# Patient Record
Sex: Female | Born: 1940 | Race: White | Hispanic: No | Marital: Married | Smoking: Never smoker
Health system: Southern US, Community
[De-identification: ages and names within clinical notes are randomized; demographics above are authoritative.]

## PROBLEM LIST (undated history)

## (undated) DIAGNOSIS — I359 Nonrheumatic aortic valve disorder, unspecified: Secondary | ICD-10-CM

## (undated) DIAGNOSIS — R609 Edema, unspecified: Secondary | ICD-10-CM

## (undated) DIAGNOSIS — I4891 Unspecified atrial fibrillation: Secondary | ICD-10-CM

## (undated) DIAGNOSIS — K219 Gastro-esophageal reflux disease without esophagitis: Secondary | ICD-10-CM

## (undated) DIAGNOSIS — M1990 Unspecified osteoarthritis, unspecified site: Secondary | ICD-10-CM

## (undated) DIAGNOSIS — I10 Essential (primary) hypertension: Secondary | ICD-10-CM

## (undated) HISTORY — DX: Gastro-esophageal reflux disease without esophagitis: K21.9

## (undated) HISTORY — DX: Essential (primary) hypertension: I10

## (undated) HISTORY — DX: Edema, unspecified: R60.9

## (undated) HISTORY — DX: Nonrheumatic aortic valve disorder, unspecified: I35.9

## (undated) HISTORY — DX: Unspecified osteoarthritis, unspecified site: M19.90

## (undated) HISTORY — DX: Unspecified atrial fibrillation: I48.91

## (undated) HISTORY — DX: Morbid (severe) obesity due to excess calories: E66.01

---

## 2004-04-12 ENCOUNTER — Other Ambulatory Visit: Payer: Self-pay

## 2005-04-07 ENCOUNTER — Ambulatory Visit: Payer: Self-pay | Admitting: Internal Medicine

## 2006-04-08 ENCOUNTER — Ambulatory Visit: Payer: Self-pay | Admitting: Unknown Physician Specialty

## 2006-04-14 ENCOUNTER — Ambulatory Visit: Payer: Self-pay | Admitting: Internal Medicine

## 2007-04-16 ENCOUNTER — Ambulatory Visit: Payer: Self-pay | Admitting: Internal Medicine

## 2007-08-05 ENCOUNTER — Ambulatory Visit: Payer: Self-pay | Admitting: Pain Medicine

## 2007-08-17 ENCOUNTER — Ambulatory Visit: Payer: Self-pay | Admitting: Pain Medicine

## 2007-09-08 ENCOUNTER — Ambulatory Visit: Payer: Self-pay | Admitting: Physician Assistant

## 2007-09-16 ENCOUNTER — Ambulatory Visit: Payer: Self-pay | Admitting: Pain Medicine

## 2007-09-30 ENCOUNTER — Ambulatory Visit: Payer: Self-pay | Admitting: Pain Medicine

## 2007-10-18 ENCOUNTER — Ambulatory Visit: Payer: Self-pay | Admitting: Physician Assistant

## 2008-04-18 ENCOUNTER — Ambulatory Visit: Payer: Self-pay | Admitting: Internal Medicine

## 2008-04-28 ENCOUNTER — Ambulatory Visit: Payer: Self-pay | Admitting: Cardiology

## 2008-04-28 LAB — CONVERTED CEMR LAB
AST: 20 units/L (ref 0–37)
Albumin: 4 g/dL (ref 3.5–5.2)
Alkaline Phosphatase: 94 units/L (ref 39–117)
BUN: 20 mg/dL (ref 6–23)
Basophils Absolute: 0 10*3/uL (ref 0.0–0.1)
Basophils Relative: 0 % (ref 0–1)
Creatinine, Ser: 0.72 mg/dL (ref 0.40–1.20)
Eosinophils Absolute: 0.2 10*3/uL (ref 0.0–0.7)
INR: 1 (ref 0.0–1.5)
MCHC: 30.8 g/dL (ref 30.0–36.0)
MCV: 88.3 fL (ref 78.0–100.0)
Neutro Abs: 6.7 10*3/uL (ref 1.7–7.7)
Neutrophils Relative %: 63 % (ref 43–77)
Platelets: 311 10*3/uL (ref 150–400)
Prothrombin Time: 13.6 s (ref 11.6–15.2)
Total Bilirubin: 0.6 mg/dL (ref 0.3–1.2)
Total Protein: 7.2 g/dL (ref 6.0–8.3)
aPTT: 34 s (ref 24–37)

## 2008-05-02 ENCOUNTER — Inpatient Hospital Stay (HOSPITAL_BASED_OUTPATIENT_CLINIC_OR_DEPARTMENT_OTHER): Admission: RE | Admit: 2008-05-02 | Discharge: 2008-05-02 | Payer: Self-pay | Admitting: Cardiology

## 2008-05-02 ENCOUNTER — Ambulatory Visit: Payer: Self-pay | Admitting: Cardiology

## 2008-05-10 ENCOUNTER — Ambulatory Visit: Payer: Self-pay | Admitting: Cardiothoracic Surgery

## 2008-05-12 ENCOUNTER — Encounter: Payer: Self-pay | Admitting: Cardiothoracic Surgery

## 2008-05-12 ENCOUNTER — Ambulatory Visit (HOSPITAL_COMMUNITY): Admission: RE | Admit: 2008-05-12 | Discharge: 2008-05-12 | Payer: Self-pay | Admitting: Cardiothoracic Surgery

## 2008-05-12 ENCOUNTER — Encounter: Admission: RE | Admit: 2008-05-12 | Discharge: 2008-05-12 | Payer: Self-pay | Admitting: Cardiothoracic Surgery

## 2008-05-19 ENCOUNTER — Ambulatory Visit: Payer: Self-pay | Admitting: Cardiothoracic Surgery

## 2008-05-25 ENCOUNTER — Ambulatory Visit: Payer: Self-pay | Admitting: Internal Medicine

## 2008-05-25 ENCOUNTER — Encounter: Payer: Self-pay | Admitting: Cardiothoracic Surgery

## 2008-05-25 ENCOUNTER — Inpatient Hospital Stay (HOSPITAL_COMMUNITY): Admission: RE | Admit: 2008-05-25 | Discharge: 2008-07-03 | Payer: Self-pay | Admitting: Cardiothoracic Surgery

## 2008-05-25 ENCOUNTER — Ambulatory Visit: Payer: Self-pay | Admitting: Cardiothoracic Surgery

## 2008-05-25 HISTORY — PX: AORTIC VALVE REPLACEMENT: SHX41

## 2008-05-25 HISTORY — PX: OTHER SURGICAL HISTORY: SHX169

## 2008-06-05 HISTORY — PX: OTHER SURGICAL HISTORY: SHX169

## 2008-06-16 ENCOUNTER — Ambulatory Visit: Payer: Self-pay | Admitting: Internal Medicine

## 2008-06-27 ENCOUNTER — Ambulatory Visit: Payer: Self-pay | Admitting: Physical Medicine & Rehabilitation

## 2008-07-21 ENCOUNTER — Ambulatory Visit: Payer: Self-pay | Admitting: Cardiothoracic Surgery

## 2008-07-21 ENCOUNTER — Encounter: Admission: RE | Admit: 2008-07-21 | Discharge: 2008-07-21 | Payer: Self-pay | Admitting: Cardiothoracic Surgery

## 2008-08-18 ENCOUNTER — Ambulatory Visit: Payer: Self-pay | Admitting: Cardiothoracic Surgery

## 2008-08-28 ENCOUNTER — Ambulatory Visit: Payer: Self-pay | Admitting: Cardiology

## 2008-09-15 ENCOUNTER — Ambulatory Visit: Payer: Self-pay | Admitting: Cardiothoracic Surgery

## 2008-09-15 ENCOUNTER — Encounter: Admission: RE | Admit: 2008-09-15 | Discharge: 2008-09-15 | Payer: Self-pay | Admitting: Cardiothoracic Surgery

## 2008-10-27 ENCOUNTER — Ambulatory Visit: Payer: Self-pay | Admitting: Cardiology

## 2009-01-05 ENCOUNTER — Ambulatory Visit: Payer: Self-pay | Admitting: Cardiology

## 2009-06-06 DIAGNOSIS — I359 Nonrheumatic aortic valve disorder, unspecified: Secondary | ICD-10-CM | POA: Insufficient documentation

## 2009-06-06 DIAGNOSIS — I10 Essential (primary) hypertension: Secondary | ICD-10-CM | POA: Insufficient documentation

## 2009-06-06 DIAGNOSIS — K219 Gastro-esophageal reflux disease without esophagitis: Secondary | ICD-10-CM | POA: Insufficient documentation

## 2009-06-06 DIAGNOSIS — R609 Edema, unspecified: Secondary | ICD-10-CM | POA: Insufficient documentation

## 2009-06-06 DIAGNOSIS — M1990 Unspecified osteoarthritis, unspecified site: Secondary | ICD-10-CM | POA: Insufficient documentation

## 2009-06-20 ENCOUNTER — Ambulatory Visit: Payer: Self-pay | Admitting: Internal Medicine

## 2009-06-29 ENCOUNTER — Telehealth: Payer: Self-pay | Admitting: Cardiology

## 2009-07-06 ENCOUNTER — Ambulatory Visit: Payer: Self-pay | Admitting: Cardiology

## 2009-07-13 ENCOUNTER — Encounter: Payer: Self-pay | Admitting: Cardiology

## 2009-07-20 ENCOUNTER — Encounter: Payer: Self-pay | Admitting: Cardiology

## 2009-07-23 ENCOUNTER — Encounter: Payer: Self-pay | Admitting: Cardiology

## 2009-07-23 DIAGNOSIS — R9389 Abnormal findings on diagnostic imaging of other specified body structures: Secondary | ICD-10-CM | POA: Insufficient documentation

## 2009-07-24 ENCOUNTER — Ambulatory Visit: Payer: Self-pay

## 2009-07-24 ENCOUNTER — Encounter: Payer: Self-pay | Admitting: Cardiology

## 2009-08-03 ENCOUNTER — Encounter: Payer: Self-pay | Admitting: Cardiology

## 2009-08-08 ENCOUNTER — Encounter: Payer: Self-pay | Admitting: Cardiology

## 2009-08-09 ENCOUNTER — Telehealth: Payer: Self-pay | Admitting: Cardiology

## 2009-08-15 ENCOUNTER — Encounter (INDEPENDENT_AMBULATORY_CARE_PROVIDER_SITE_OTHER): Payer: Self-pay | Admitting: Cardiology

## 2009-08-20 ENCOUNTER — Telehealth: Payer: Self-pay | Admitting: Cardiology

## 2009-08-22 ENCOUNTER — Encounter (INDEPENDENT_AMBULATORY_CARE_PROVIDER_SITE_OTHER): Payer: Self-pay | Admitting: *Deleted

## 2009-08-24 ENCOUNTER — Ambulatory Visit (HOSPITAL_COMMUNITY): Admission: RE | Admit: 2009-08-24 | Discharge: 2009-08-24 | Payer: Self-pay | Admitting: Internal Medicine

## 2009-08-24 ENCOUNTER — Ambulatory Visit: Payer: Self-pay | Admitting: Internal Medicine

## 2009-08-27 ENCOUNTER — Telehealth: Payer: Self-pay | Admitting: Cardiology

## 2009-08-29 ENCOUNTER — Encounter (INDEPENDENT_AMBULATORY_CARE_PROVIDER_SITE_OTHER): Payer: Self-pay | Admitting: Cardiology

## 2009-08-31 ENCOUNTER — Ambulatory Visit: Payer: Self-pay | Admitting: Cardiology

## 2009-08-31 ENCOUNTER — Ambulatory Visit: Payer: Self-pay | Admitting: Cardiovascular Disease

## 2009-09-12 ENCOUNTER — Encounter (INDEPENDENT_AMBULATORY_CARE_PROVIDER_SITE_OTHER): Payer: Self-pay | Admitting: Cardiology

## 2009-09-27 ENCOUNTER — Encounter (INDEPENDENT_AMBULATORY_CARE_PROVIDER_SITE_OTHER): Payer: Self-pay | Admitting: Cardiology

## 2009-10-11 ENCOUNTER — Encounter: Payer: Self-pay | Admitting: Cardiology

## 2009-10-15 IMAGING — CT CT ANGIO CHEST
4 of 6 series · 19 of 36 positions shown · IV contrast (agent unspecified)
Comparison: None

CLINICAL DATA: Thoracic aortic aneurysm

CT ANGIOGRAPHY CHEST
TECHNIQUE: Multidetector CT imaging of the chest using the
standard protocol during bolus administration of intravenous
contrast. Multiplanar reconstructed images obtained and reviewed to
evaluate the vascular anatomy.
Contrast: 100 ml [id number]

[Series 5: angio · axial · 0.79mm/px · z∈[-354,-14]mm · 11 of 164 slices shown]
[im 14/164  lung]
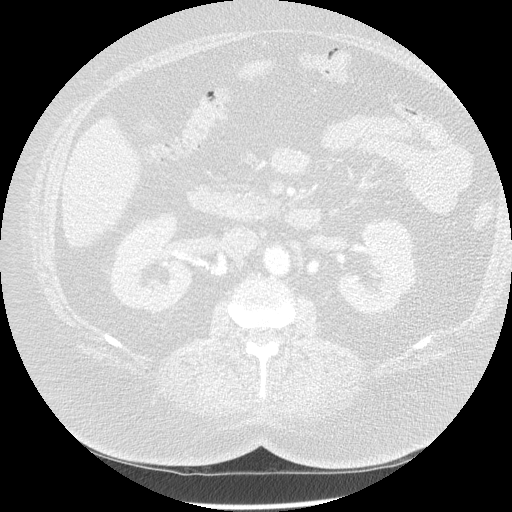
[im 28/164  mediastinal]
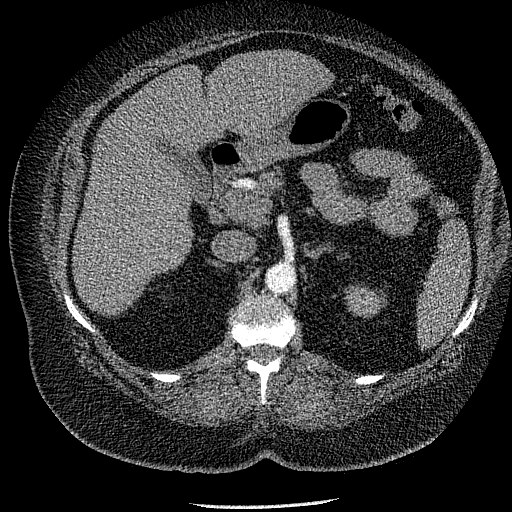
[im 41/164  lung]
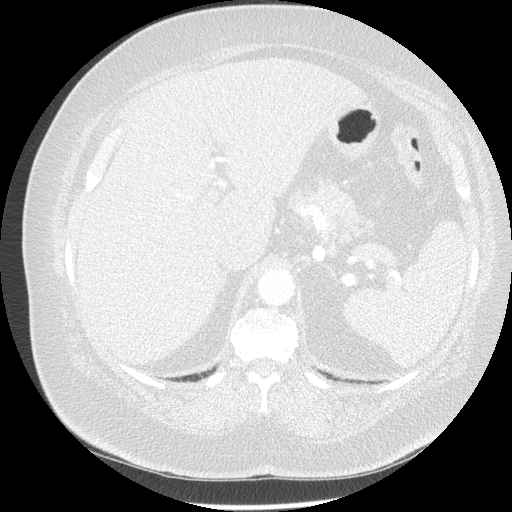
[im 55/164  mediastinal]
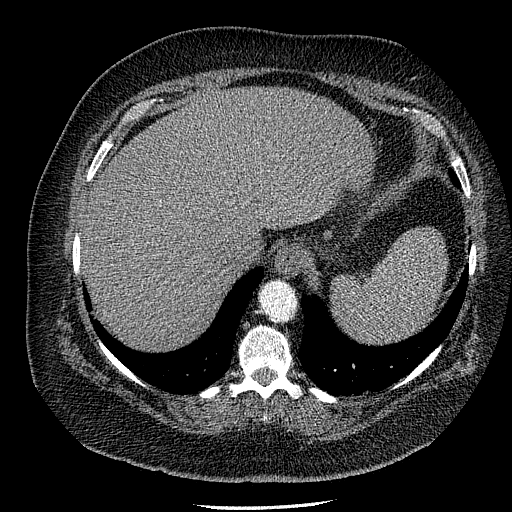
[im 68/164  lung]
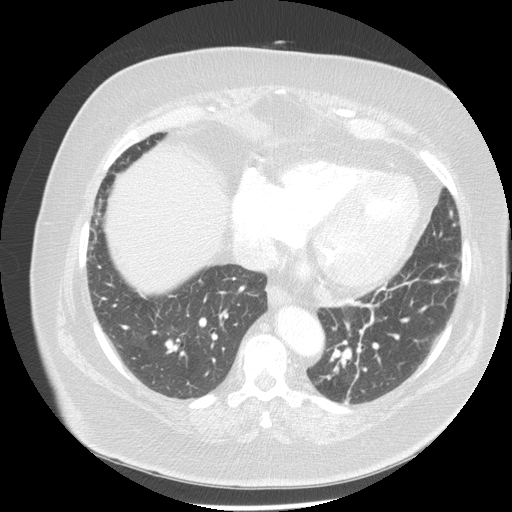
[im 82/164  mediastinal]
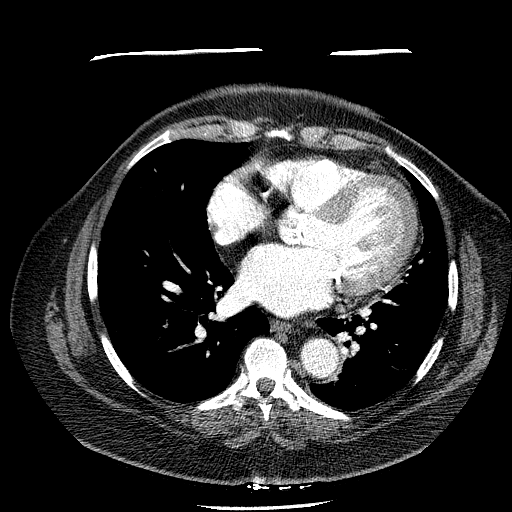
[im 96/164  lung]
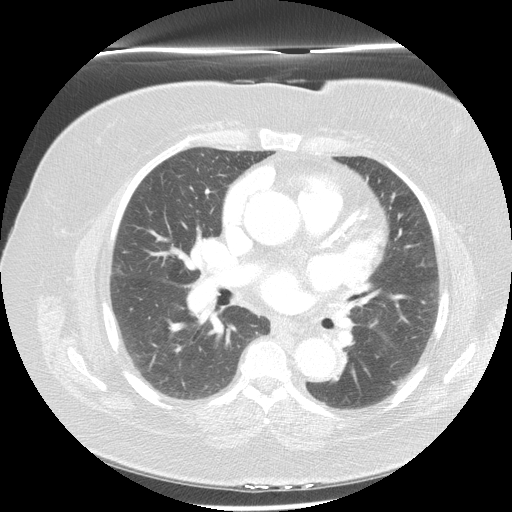
[im 109/164  mediastinal]
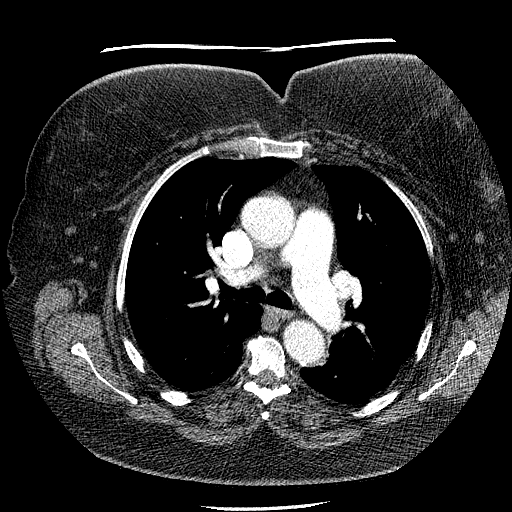
[im 123/164  lung]
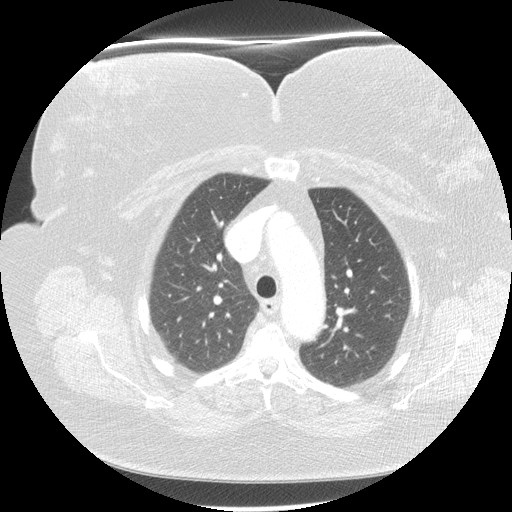
[im 136/164  mediastinal]
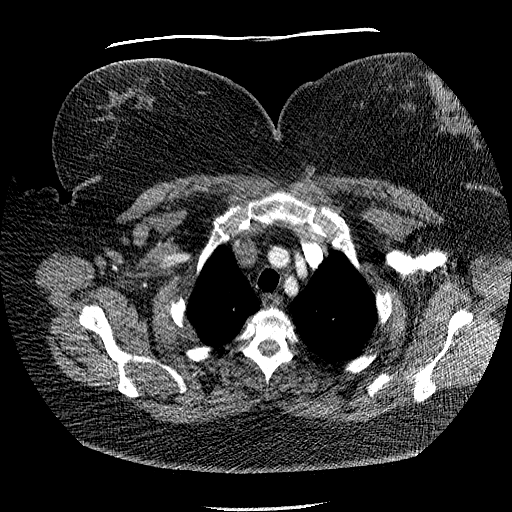
[im 150/164  lung]
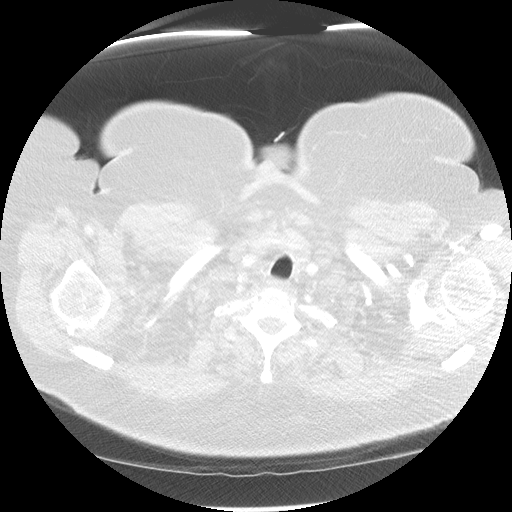

[Series 6: lung windows · axial · 0.79mm/px · z∈[-264,-194]mm · 2 of 72 slices shown]
[im 15/72  mediastinal]
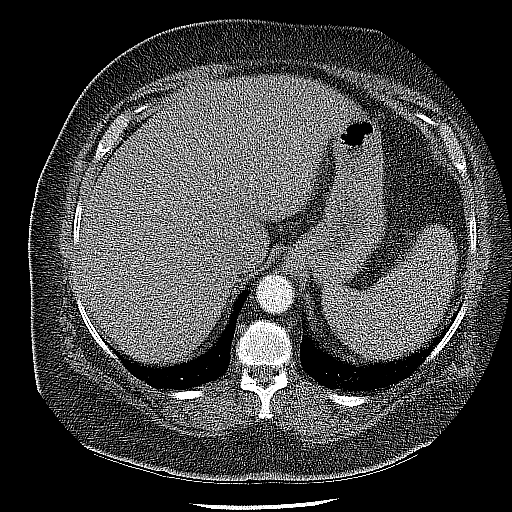
[im 29/72  mediastinal]
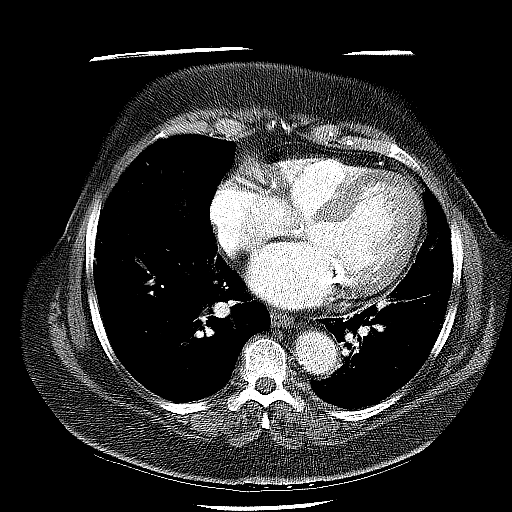

[Series 400: oblique · sagittal · 0.82mm/px · 3 of 66 slices shown]
[im 17/66  lung]
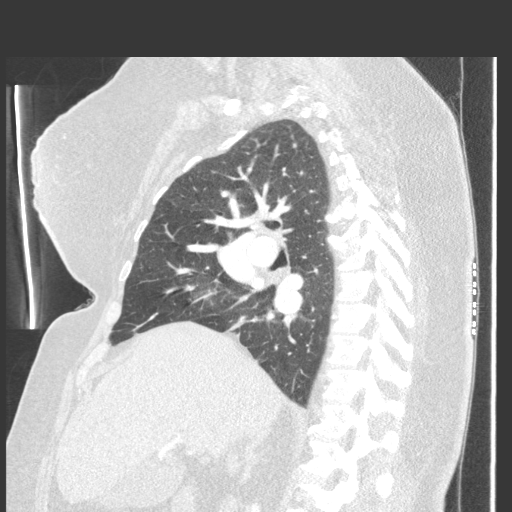
[im 33/66  lung]
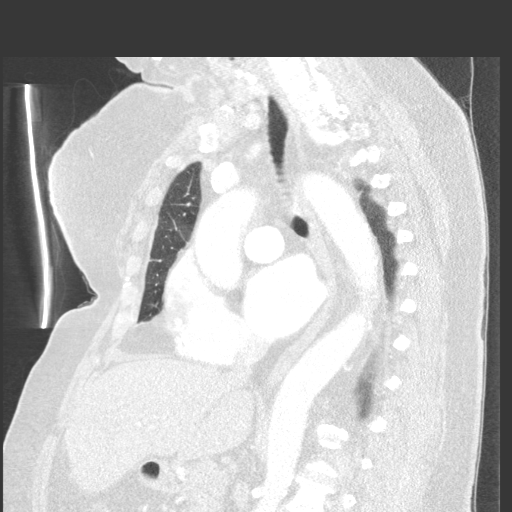
[im 49/66  lung]
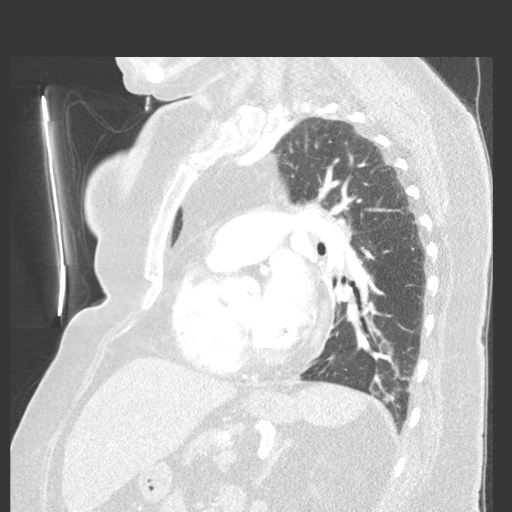

[Series 602: sagittal body · sagittal · 0.80mm/px · 3 of 164 slices shown]
[im 33/164  mediastinal]
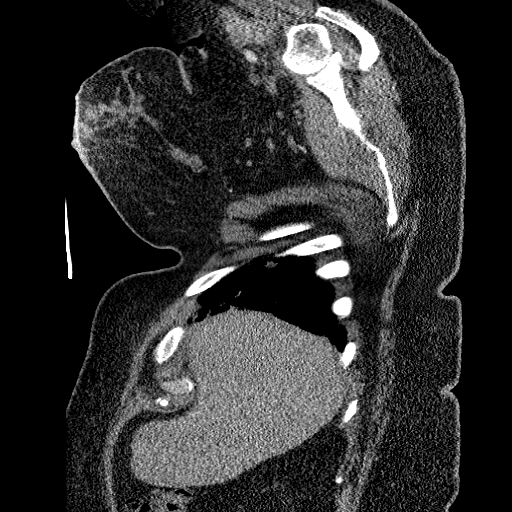
[im 66/164  mediastinal]
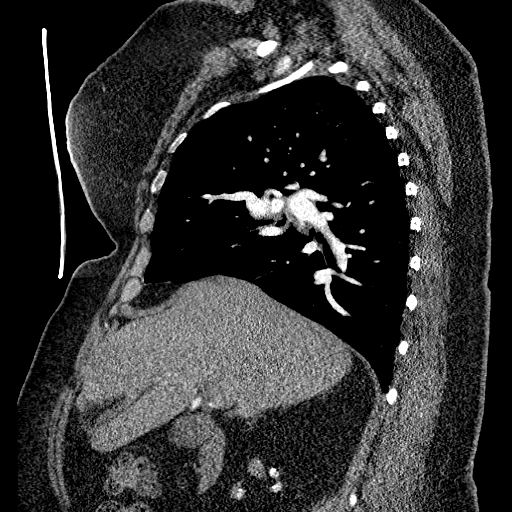
[im 98/164  mediastinal]
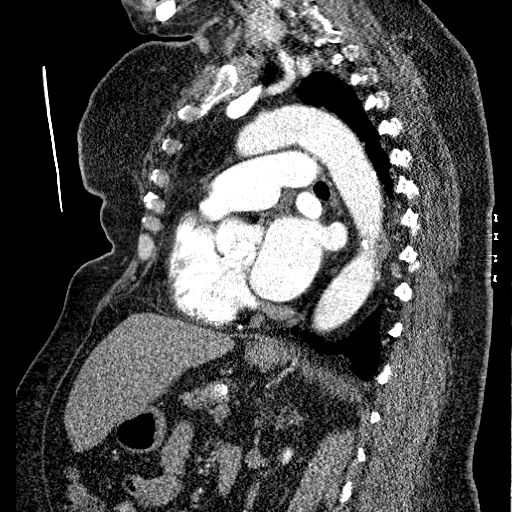

[19 of 36 positions shown; findings below may reference images not displayed]

FINDINGS: There is no hyperdense crescent in the wall of the
thoracic aorta to suggest the presence of intramural hematoma.
Imaging after IV contrast administration shows borderline thoracic
aortic aneurysm.  The ascending thoracic aorta measures 4.0 x
cm in maximum orthogonal diameters. Transverse thoracic aorta
measures 3 cm in maximum diameter.  The descending thoracic aorta
is tortuous measuring 3.1 cm in maximum diameter. Arch vessel
branching pattern is normal although the arch vessels follow a
tortuous course in the mediastinum.

There is no axillary, mediastinal, or hilar lymphadenopathy.  No
evidence for thyroid nodule.  The heart is enlarged.  There is no
pericardial effusion.  Coronary artery, mitral valve, and aortic
valve calcification is evident.  No evidence for pleural effusion.

Lung windows show a 6 mm right lower lobe pulmonary nodule on image
43.  Subsegmental elective cysts and / or linear scarring is seen
in both lower lobes..
IMPRESSION: Borderline aneurysm of the ascending thoracic aorta without
aneurysmal dilatation of the transverse or descending segments.

6 mm right lower lobe pulmonary nodule. In a patient with no
history of smoking or malignancy, followup CT chest without
contrast in 6-12 months can be used to assess stability.  In a high
risk patient followup imaging in 3 months is recommended.

## 2009-11-28 ENCOUNTER — Encounter: Payer: Self-pay | Admitting: Cardiology

## 2009-12-03 ENCOUNTER — Ambulatory Visit: Payer: Self-pay | Admitting: Cardiology

## 2009-12-03 DIAGNOSIS — Z954 Presence of other heart-valve replacement: Secondary | ICD-10-CM

## 2009-12-03 DIAGNOSIS — I2510 Atherosclerotic heart disease of native coronary artery without angina pectoris: Secondary | ICD-10-CM | POA: Insufficient documentation

## 2010-01-12 ENCOUNTER — Ambulatory Visit: Payer: Self-pay | Admitting: Cardiology

## 2010-01-12 ENCOUNTER — Inpatient Hospital Stay (HOSPITAL_COMMUNITY): Admission: EM | Admit: 2010-01-12 | Discharge: 2010-01-17 | Payer: Self-pay | Admitting: Emergency Medicine

## 2010-01-13 ENCOUNTER — Encounter: Payer: Self-pay | Admitting: Cardiology

## 2010-01-18 ENCOUNTER — Encounter: Payer: Self-pay | Admitting: Cardiology

## 2010-01-18 ENCOUNTER — Telehealth: Payer: Self-pay | Admitting: Cardiology

## 2010-01-30 ENCOUNTER — Ambulatory Visit: Payer: Self-pay | Admitting: Cardiology

## 2010-01-31 ENCOUNTER — Encounter: Payer: Self-pay | Admitting: Cardiology

## 2010-02-06 ENCOUNTER — Encounter: Payer: Self-pay | Admitting: Cardiology

## 2010-02-19 ENCOUNTER — Encounter: Payer: Self-pay | Admitting: Cardiology

## 2010-03-04 ENCOUNTER — Encounter: Payer: Self-pay | Admitting: Cardiology

## 2010-08-09 ENCOUNTER — Ambulatory Visit: Payer: Self-pay | Admitting: Internal Medicine

## 2010-09-13 ENCOUNTER — Ambulatory Visit: Payer: Self-pay | Admitting: Cardiology

## 2011-01-28 NOTE — Assessment & Plan Note (Signed)
Summary: eph/  gd   Visit Type:  Follow-up Primary Provider:  Bethann Punches, MD  CC:  AVR.  History of Present Illness: The patient presents for followup after hospitalization for management of dyspnea with acute on chronic next systolic and diastolic heart failure. She ruled out for myocardial infarction and did have a stress perfusion study in hospital demonstrating an EF of 50% with no evidence of ischemia or infarct. She was diureses. She was made to keep her legs elevated which greatly helped reduce her lower extremity edema.  Since going home she has been trying to keep her feet elevated and she says her weight has been down. Her legs have been less swollen. Physical therapy and a nurse her coming to her house. She is ambulating. She is not having any falls and none of the difficulty with balance or gait but she was having previously. The cough that she was experiencing in the hospital seems to be improved. She's had no fevers or chills. She is not describing PND or orthopnea. She's had no palpitations, presyncope or syncope. She denies any chest pain.  Current Medications (verified): 1)  Potassium Chloride Cr 10 Meq Cr-Tabs (Potassium Chloride) .... 2 By Mouth Daily 2)  Vitamin B-1 .... Daily 3)  Aspirin 81 Mg  Tabs (Aspirin) .Marland Kitchen.. 1 By Mouth Daily 4)  Evening Primrose Oil 500 Mg Caps (Evening Primrose Oil) .... Daily 5)  Diltiazem Hcl Er Beads 180 Mg Xr24h-Cap (Diltiazem Hcl Er Beads) .... Daily 6)  Metoprolol Tartrate 50 Mg Tabs (Metoprolol Tartrate) .... Two Times A Day 7)  Torsemide 20 Mg Tabs (Torsemide) .Marland Kitchen.. 1 By Mouth Two Times A Day 8)  Ranitidine Hcl 150 Mg Caps (Ranitidine Hcl) .... Two Times A Day 9)  Simvastatin 40 Mg Tabs (Simvastatin) .... Daily 10)  Gabapentin 300 Mg Caps (Gabapentin) .... Two Times A Day As Needed 11)  Warfarin Sodium 5 Mg Tabs (Warfarin Sodium) .... One By Mouth Daily or As Directed 12)  Acetaminophen 325 Mg  Tabs (Acetaminophen) .... As Needed 13)   Vitron-C 125-200 Mg Tabs (Ferrous Fumarate-Vitamin C) .Marland Kitchen.. 1 By Mouth Daily 14)  Losartan Potassium 100 Mg Tabs (Losartan Potassium) .Marland Kitchen.. 1 By Mouth Daily 15)  Crestor 20 Mg Tabs (Rosuvastatin Calcium) .... 1/2  By Mouth Daily 16)  Metolazone 2.5 Mg Tabs (Metolazone) .... One By Mouth Every Monday Am  Allergies (verified): 1)  ! Sulfa  Past History:  Past Medical History: Reviewed history from 07/06/2009 and no changes required. AORTIC STENOSIS, SEVERE (ICD-424.1) FIBRILLATION, ATRIAL (ICD-427.31) HYPERTENSION, UNSPECIFIED (ICD-401.9) EDEMA LEG (ICD-782.3) OBESITY, MORBID (ICD-278.01) GERD (ICD-530.81) OSTEOARTHRITIS (ICD-715.90)    Past Surgical History: Reviewed history from 07/06/2009 and no changes required.  1. 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   2. Coronary bypass grafting x2 (left internal mammary artery LAD, ..05/25/2008       saphenous vein graft to distal RCA).   3. Endoscopic vein harvest of the right leg greater saphenous vein. ..05/25/2008   4. Placement of tracheostomy (#8 Shiley). .. 06/05/2008  5. Placement of left pleural chest tube for drainage of pleural       effusion. ... 06/05/2008  6. Flexible bronchoscopy for airway secretions. .. 06/05/2008   SURGEON:  Kerin Perna, MD.   Review of Systems       As stated in the HPI and negative for all other systems.   Vital  Signs:  Patient profile:   70 year old female Height:      65 inches Weight:      280 pounds BMI:     46.76 Pulse rate:   97 / minute Resp:     18 per minute BP sitting:   144 / 82  (right arm)  Vitals Entered By: Marrion Coy, CNA (January 30, 2010 3:17 PM)  Physical Exam  General:  Well developed, well nourished, in no acute distress. Head:  normocephalic and atraumatic Eyes:  PERRLA/EOM intact; conjunctiva and lids normal. Mouth:  Edentulous, gums and palate normal. Oral  mucosa normal. Neck:  Neck supple, no JVD. No masses, thyromegaly or abnormal cervical nodes. Chest Wall:  Well healed surgical scar Lungs:  Clear bilaterally to auscultation and percussion. Abdomen:  Bowel sounds positive; abdomen soft and non-tender without masses, organomegaly, or hernias noted. No hepatosplenomegaly, obese Msk:  Back normal, normal gait. Muscle strength and tone normal. Extremities:  mild bilateral lower extremity edema Neurologic:  Alert and oriented x 3. Skin:  Intact without lesions or rashes. Psych:  Normal affect.   Detailed Cardiovascular Exam  Neck    Carotids: Carotids full and equal bilaterally without bruits.      Neck Veins: Normal, no JVD.    Heart    Inspection: no deformities or lifts noted.      Palpation: normal PMI with no thrills palpable.      Auscultation: irregular rate and rhythm, S1, S2 without rubs, gallops, or clicks, 2/6 systolic murmur radiating slightly up the aortic outflow tract   Vascular    Abdominal Aorta: no palpable masses, pulsations, or audible bruits.      Femoral Pulses: normal femoral pulses bilaterally.      Pedal Pulses: diminished right dorsalis pedis pulse and diminished right posterior tibial pulse.      Radial Pulses: normal radial pulses bilaterally.      Peripheral Circulation: no clubbing, cyanosis, or edema noted with normal capillary refill.     EKG  Procedure date:  01/30/2010  Findings:      atrial fibrillation, rate 97, right bundle branch block, premature ventricular contraction  Impression & Recommendations:  Problem # 1:  CHRONIC COMB SYSTOLIC&DIASTOLIC HEART FAILURE (ICD-428.42) The patient seems to be relatively euvolemic and compliant with medications. I have given her written instructions to get a basic metabolic profile when she sees Dr. Hyacinth Meeker. He apparently checks her blood work frequently.  Otherwise I will make no change to her medical regimen.  Problem # 2:  FIBRILLATION, ATRIAL.Marland Kitchen  POSTOPERATIVE (ICD-427.31) Her ventricular rhythm has been controlled. She is not falling and I am not concerned about this as a risk for Coumadin. She certainly needs Coumadin for stroke prophylaxis. This is followed closely by Dr. Hyacinth Meeker. Orders: EKG w/ Interpretation (93000)  Problem # 3:  OBESITY, MORBID (ICD-278.01) She understands the need to lose weight with diet and exercise.  Problem # 4:  CAD (ICD-414.00) Her stress perfusion study was negative in the hospital. No further testing is suggested. Orders: EKG w/ Interpretation (93000)  Patient Instructions: 1)  Your physician recommends that you schedule a follow-up appointment in: 6 months with Dr Antoine Poche 2)  Your physician recommends that you continue on your current medications as directed. Please refer to the Current Medication list given to you today.

## 2011-01-28 NOTE — Letter (Signed)
Summary: Home Health Cert and Plan of Care  Home Health Cert and Plan of Care   Imported By: Marylou Mccoy 02/14/2010 13:28:16  _____________________________________________________________________  External Attachment:    Type:   Image     Comment:   External Document

## 2011-01-28 NOTE — Miscellaneous (Signed)
Summary: [facility] Orders  [facility] Orders   Imported By: Roderic Ovens 03/21/2010 14:31:16  _____________________________________________________________________  External Attachment:    Type:   Image     Comment:   External Document

## 2011-01-28 NOTE — Assessment & Plan Note (Signed)
Summary: 6 month rov  428.0 pfh,rn   Visit Type:  Follow-up Primary Provider:  Bethann Punches, MD  CC:  AVR.  History of Present Illness: The patient presents for followup of aortic valve replacement. Since I last saw her she has had no acute problems. She denies any new shortness of breath, PND or orthopnea. She is predominantly limited by joint discomfort I suspect in part related to her morbid obesity. She has had none of the volume issues that she had earlier this year. She occasionally will feel her heart fluttering but this is not particularly problematic. She is not describing any PND or orthopnea. Has had no presyncope or syncope. She's had no chest pressure, neck or arm discomfort.  Current Medications (verified): 1)  Potassium Chloride Cr 10 Meq Cr-Tabs (Potassium Chloride) .... 2 By Mouth Daily 2)  Aspirin 81 Mg  Tabs (Aspirin) .Marland Kitchen.. 1 By Mouth Daily 3)  Evening Primrose Oil 500 Mg Caps (Evening Primrose Oil) .... Daily 4)  Diltiazem Hcl Er Beads 180 Mg Xr24h-Cap (Diltiazem Hcl Er Beads) .... Daily 5)  Metoprolol Tartrate 50 Mg Tabs (Metoprolol Tartrate) .... Two Times A Day 6)  Torsemide 20 Mg Tabs (Torsemide) .Marland Kitchen.. 1 By Mouth Two Times A Day 7)  Ranitidine Hcl 150 Mg Caps (Ranitidine Hcl) .... Two Times A Day 8)  Simvastatin 40 Mg Tabs (Simvastatin) .... Daily 9)  Gabapentin 300 Mg Caps (Gabapentin) .... Two Times A Day As Needed 10)  Warfarin Sodium 5 Mg Tabs (Warfarin Sodium) .... One By Mouth Daily or As Directed 11)  Acetaminophen 325 Mg  Tabs (Acetaminophen) .... As Needed 12)  Vitron-C 125-200 Mg Tabs (Ferrous Fumarate-Vitamin C) .Marland Kitchen.. 1 By Mouth Daily 13)  Losartan Potassium 100 Mg Tabs (Losartan Potassium) .Marland Kitchen.. 1 By Mouth Daily 14)  Metolazone 2.5 Mg Tabs (Metolazone) .... One By Mouth Every Monday Am 15)  Sertraline Hcl 50 Mg Tabs (Sertraline Hcl) .Marland Kitchen.. 1 Podaily 16)  Meloxicam 7.5 Mg Tabs (Meloxicam) .... As Needed 17)  Magnesium 500 Mg Tabs (Magnesium) .Marland Kitchen.. 1 Pod  Aily 18)  Vitamin C 500 Mg Tabs (Ascorbic Acid) .Marland Kitchen.. 1 By Mouth Daily  Allergies (verified): 1)  ! Sulfa  Past History:  Past Medical History: Reviewed history from 07/06/2009 and no changes required. AORTIC STENOSIS, SEVERE (ICD-424.1) FIBRILLATION, ATRIAL (ICD-427.31) HYPERTENSION, UNSPECIFIED (ICD-401.9) EDEMA LEG (ICD-782.3) OBESITY, MORBID (ICD-278.01) GERD (ICD-530.81) OSTEOARTHRITIS (ICD-715.90)    Past Surgical History: Reviewed history from 07/06/2009 and no changes required.  1. 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   2. Coronary bypass grafting x2 (left internal mammary artery LAD, ..05/25/2008       saphenous vein graft to distal RCA).   3. Endoscopic vein harvest of the right leg greater saphenous vein. ..05/25/2008   4. Placement of tracheostomy (#8 Shiley). .. 06/05/2008  5. Placement of left pleural chest tube for drainage of pleural       effusion. ... 06/05/2008  6. Flexible bronchoscopy for airway secretions. .. 06/05/2008   SURGEON:  Kerin Perna, MD.   Review of Systems       As stated in the HPI and negative for all other systems.   Vital Signs:  Patient profile:   70 year old female Height:      65 inches Weight:      305 pounds BMI:     50.94 Pulse rate:   86 / minute  Resp:     18 per minute BP sitting:   141 / 96  (right arm)  Vitals Entered By: Marrion Coy, CNA (September 13, 2010 3:42 PM)  Physical Exam  General:  Well developed, well nourished, in no acute distress. Head:  normocephalic and atraumatic Mouth:  Edentulous, gums and palate normal. Oral mucosa normal. Neck:  Neck supple, no JVD. No masses, thyromegaly or abnormal cervical nodes. Chest Wall:  Well healed surgical scar Lungs:  Clear bilaterally to auscultation and percussion. Abdomen:  Bowel sounds positive; abdomen soft and non-tender without masses, organomegaly, or  hernias noted. No hepatosplenomegaly, obese Msk:  Back normal, normal gait. Muscle strength and tone normal. Extremities:  mild bilateral lower extremity edema Neurologic:  Alert and oriented x 3. Skin:  Intact without lesions or rashes. Cervical Nodes:  no significant adenopathy Inguinal Nodes:  no significant adenopathy Psych:  Normal affect.   Detailed Cardiovascular Exam  Neck    Carotids: Carotids full and equal bilaterally without bruits.      Neck Veins: Normal, no JVD.    Heart    Inspection: no deformities or lifts noted.      Palpation: normal PMI with no thrills palpable.      Auscultation: irregular rate and rhythm, S1, S2 without rubs, gallops, or clicks, 2/6 systolic murmur radiating slightly up the aortic outflow tract   Vascular    Abdominal Aorta: no palpable masses, pulsations, or audible bruits.      Femoral Pulses: normal femoral pulses bilaterally.      Pedal Pulses: diminished right dorsalis pedis pulse and diminished right posterior tibial pulse.      Radial Pulses: normal radial pulses bilaterally.      Peripheral Circulation: no clubbing, cyanosis, or edema noted with normal capillary refill.     EKG  Procedure date:  09/13/2010  Findings:      atrial fibrillation, low voltage, rate 86, right bundle branch block, no acute ST-T wave changes  Impression & Recommendations:  Problem # 1:  AORTIC VALVE REPLACEMENT, HX OF (ICD-V43.3) At this point no further cardiovascular testing is suggested. She will continue with the medications as listed. Orders: EKG w/ Interpretation (93000)  Problem # 2:  FIBRILLATION, ATRIAL.Marland Kitchen POSTOPERATIVE (ICD-427.31) The patient does not have particularly symptomatic tachypalpitations. She tolerates Coumadin. She has had reasonable rate control. No change in therapy is indicated. Orders: EKG w/ Interpretation (93000)  Problem # 3:  CAD (ICD-414.00) The patient is having no ongoing angina. She should continue with risk  reduction. I will defer followup of her lipid status to her primary physician.  Problem # 4:  HYPERTENSION, UNSPECIFIED (ICD-401.9) Though her blood pressure is slightly elevated today she reports is in the 120s over 80s routinely. Therefore, I will make no change to her regimen.  Patient Instructions: 1)  Your physician recommends that you schedule a follow-up appointment in: 12 months with Dr Antoine Poche 2)  Your physician recommends that you continue on your current medications as directed. Please refer to the Current Medication list given to you today.

## 2011-01-28 NOTE — Letter (Signed)
Summary: [facility] Patient Care Update  [facility] Patient Care Update   Imported By: Roderic Ovens 03/11/2010 14:31:58  _____________________________________________________________________  External Attachment:    Type:   Image     Comment:   External Document

## 2011-01-28 NOTE — Progress Notes (Signed)
Summary: QUESTION ABOUT PT MEDICATIONS  Phone Note Other Incoming   Caller: [facility] /[phone] [facility] Summary of Call: Have question about the pt medication Initial call taken by: Judie Grieve,  January 18, 2010 11:05 AM     Appended Document: QUESTION ABOUT PT MEDICATIONS Spoke [facility] Nurse Bonita Quin, pt needs rx for metolazone 2.5 mg to be taken every Monday am, will send to [facility].  Pt to be on simvastatin per d/c summary.  Pt is waiting on mail order meds to come in the [facility] will f/u to make sure she recieves it.

## 2011-01-28 NOTE — Letter (Signed)
Summary: [facility]  [facility]   Imported By: Marylou Mccoy 07/16/2010 14:04:33  _____________________________________________________________________  External Attachment:    Type:   Image     Comment:   External Document

## 2011-01-28 NOTE — Miscellaneous (Signed)
Summary: Orders Update  Clinical Lists Changes  Medications: Added new medication of METOLAZONE 2.5 MG TABS (METOLAZONE) one by mouth every Monday am - Signed Rx of METOLAZONE 2.5 MG TABS (METOLAZONE) one by mouth every Monday am;  #30 x 1;  Signed;  Entered by: Charolotte Capuchin, RN;  Authorized by: Rollene Rotunda, MD, [facility];  Method used: [facility] to [id number] [address] [place], [place]  [address], Ph: [phone] or [phone], Fax: [phone]    Prescriptions: METOLAZONE 2.5 MG TABS (METOLAZONE) one by mouth every Monday am  #30 x 1   Entered by:   Charolotte Capuchin, RN   Authorized by:   Rollene Rotunda, MD, [facility]   Signed by:   Charolotte Capuchin, RN on 01/18/2010   Method used:   [facility]ically to        [id number] (retail)       [address]       [place], [place]  [address]       Ph: [phone] or [phone]       Fax: [phone]   RxID:   [id number]

## 2011-03-16 LAB — BASIC METABOLIC PANEL
BUN: 15 mg/dL (ref 6–23)
BUN: 14 mg/dL (ref 6–23)
BUN: 19 mg/dL (ref 6–23)
CO2: 31 mEq/L (ref 19–32)
Calcium: 9 mg/dL (ref 8.4–10.5)
Calcium: 9 mg/dL (ref 8.4–10.5)
Calcium: 9.3 mg/dL (ref 8.4–10.5)
Chloride: 97 mEq/L (ref 96–112)
Chloride: 100 mEq/L (ref 96–112)
Chloride: 99 mEq/L (ref 96–112)
Creatinine, Ser: 0.61 mg/dL (ref 0.4–1.2)
Creatinine, Ser: 0.65 mg/dL (ref 0.4–1.2)
Creatinine, Ser: 0.74 mg/dL (ref 0.4–1.2)
GFR calc Af Amer: >60 mL/min (ref >60)
GFR calc non Af Amer: >60 mL/min (ref >60)
GFR calc non Af Amer: >60 mL/min (ref >60)
GFR calc non Af Amer: >60 mL/min (ref >60)
GFR calc non Af Amer: >60 mL/min (ref >60)
Glucose, Bld: 102 mg/dL — ABNORMAL HIGH (ref 70–99)
Glucose, Bld: 104 mg/dL — ABNORMAL HIGH (ref 70–99)
Glucose, Bld: 107 mg/dL — ABNORMAL HIGH (ref 70–99)
Potassium: 3.4 mEq/L — ABNORMAL LOW (ref 3.5–5.1)
Potassium: 3.6 mEq/L (ref 3.5–5.1)
Sodium: 138 mEq/L (ref 135–145)

## 2011-03-16 LAB — PROTIME-INR
INR: 1.39 (ref 0.00–1.49)
INR: 1.4 (ref 0.00–1.49)
INR: 1.41 (ref 0.00–1.49)
INR: 1.51 — ABNORMAL HIGH (ref 0.00–1.49)
INR: 1.76 — ABNORMAL HIGH (ref 0.00–1.49)
Prothrombin Time: 20.7 seconds — ABNORMAL HIGH (ref 11.6–15.2)
Prothrombin Time: 16.9 seconds — ABNORMAL HIGH (ref 11.6–15.2)
Prothrombin Time: 17 seconds — ABNORMAL HIGH (ref 11.6–15.2)
Prothrombin Time: 17.1 seconds — ABNORMAL HIGH (ref 11.6–15.2)
Prothrombin Time: 18.1 seconds — ABNORMAL HIGH (ref 11.6–15.2)
Prothrombin Time: 20.4 seconds — ABNORMAL HIGH (ref 11.6–15.2)

## 2011-03-16 LAB — CK TOTAL AND CKMB (NOT AT ARMC)
CK, MB: 2.5 ng/mL (ref 0.3–4.0)
Relative Index: INVALID (ref 0.0–2.5)
Relative Index: INVALID (ref 0.0–2.5)
Relative Index: INVALID (ref 0.0–2.5)
Total CK: 40 U/L (ref 7–177)

## 2011-03-16 LAB — CBC
HCT: 42.9 % (ref 36.0–46.0)
Hemoglobin: 14.1 g/dL (ref 12.0–15.0)
Hemoglobin: 14.6 g/dL (ref 12.0–15.0)
MCV: 79.7 fL (ref 78.0–100.0)
Platelets: 150 10*3/uL (ref 150–400)
Platelets: 214 10*3/uL (ref 150–400)
RBC: 5.33 MIL/uL — ABNORMAL HIGH (ref 3.87–5.11)
RDW: 22.6 % — ABNORMAL HIGH (ref 11.5–15.5)
RDW: 22.1 % — ABNORMAL HIGH (ref 11.5–15.5)
RDW: 22.2 % — ABNORMAL HIGH (ref 11.5–15.5)
WBC: 8.5 10*3/uL (ref 4.0–10.5)
WBC: 7.9 10*3/uL (ref 4.0–10.5)
WBC: 8.6 10*3/uL (ref 4.0–10.5)

## 2011-03-16 LAB — HEPARIN LEVEL (UNFRACTIONATED): Heparin Unfractionated: 0.55 IU/mL (ref 0.30–0.70)

## 2011-03-16 LAB — DIFFERENTIAL
Basophils Absolute: 0 10*3/uL (ref 0.0–0.1)
Eosinophils Relative: 0 % (ref 0–5)
Monocytes Absolute: 0.8 10*3/uL (ref 0.1–1.0)
Neutrophils Relative %: 75 % (ref 43–77)

## 2011-03-16 LAB — BRAIN NATRIURETIC PEPTIDE
Pro B Natriuretic peptide (BNP): 440 pg/mL — ABNORMAL HIGH (ref 0.0–100.0)
Pro B Natriuretic peptide (BNP): 221 pg/mL — ABNORMAL HIGH (ref 0.0–100.0)

## 2011-03-16 LAB — HEMOCCULT GUIAC POC 1CARD (OFFICE): Fecal Occult Bld: NEGATIVE

## 2011-03-16 LAB — TROPONIN I
Troponin I: 0.04 ng/mL (ref 0.00–0.06)
Troponin I: 0.05 ng/mL (ref 0.00–0.06)
Troponin I: 0.03 ng/mL (ref 0.00–0.06)

## 2011-03-16 LAB — POCT CARDIAC MARKERS: Myoglobin, poc: 108 ng/mL (ref 12–200)

## 2011-04-05 LAB — PROTIME-INR
INR: 3.2 — ABNORMAL HIGH (ref 0.00–1.49)
Prothrombin Time: 32.4 seconds — ABNORMAL HIGH (ref 11.6–15.2)

## 2011-04-05 LAB — BASIC METABOLIC PANEL
Calcium: 9 mg/dL (ref 8.4–10.5)
Creatinine, Ser: 0.67 mg/dL (ref 0.4–1.2)
GFR calc Af Amer: >60 mL/min (ref >60)
GFR calc non Af Amer: >60 mL/min (ref >60)

## 2011-04-05 LAB — CBC
Platelets: 191 10*3/uL (ref 150–400)
RBC: 5.26 MIL/uL — ABNORMAL HIGH (ref 3.87–5.11)
WBC: 7.5 10*3/uL (ref 4.0–10.5)

## 2011-05-13 NOTE — Discharge Summary (Signed)
NAME:  [name] [name], [name] [name] NO.:  [id number]   MEDICAL RECORD NO.:  [id number]          PATIENT TYPE:  INP   LOCATION:  3310                         FACILITY:  [facility]   PHYSICIAN:  Kerin Perna, M.D.  DATE OF BIRTH:  [birth year]   DATE OF ADMISSION:  05/25/2008  DATE OF DISCHARGE:                               DISCHARGE SUMMARY   PRIMARY ADMITTING DIAGNOSES:  1. Severe aortic stenosis.  2. Severe 2-vessel coronary artery disease with class III congestive      heart failure.   ADDITIONAL DISCHARGE DIAGNOSES:  1. Severe aortic stenosis.  2. Severe 2-vessel coronary artery disease with class III congestive      heart failure.  3. Postoperative atrial fibrillation.  4. Ventilator dependent respiratory failure.  5. Ventilator acquired pneumonia (Enterobacter cloacae).  6. Hypertension.  7. Osteoarthritis.  8. Gastroesophageal reflux disease.  9. History of benign breast mass status post biopsy.  10.History of right lower extremity phlebitis.  11.Postoperative anemia.  12.Postoperative deconditioning.  13.Postoperative dysphagia.   PROCEDURES PERFORMED:  1. Aortic valve replacement with Edwards Magna 23 mm pericardial      valve.  2. Coronary artery bypass grafting x2 (left internal mammary artery to      the ALD, saphenous vein graft to the distal right coronary artery).  3. Endoscopic vein harvest right leg.  4. Placement of #8 Shiley tracheostomy tube.  5. Placement of left pleural chest tube.  6. Flexible bronchoscopy.   HISTORY:  The patient is a 70 year old female who was seen in outpatient  consultation by Dr. Kathlee Nations Tright for severe aortic stenosis.  She  initially presented with progressive dyspnea on exertion and  significantly decreased exercise tolerance.  She has a known cardiac  murmur, which has been followed by Dr. Hyacinth Meeker for the past 4 to 5 years  with serial 2D echoes.  Most recently she was found to have an increase  in the  transvalvular gradient with a decreased aortic valve area to 0.8  with peak gradient of 87 mmHg.  She was subsequently referred to Dr.  Rollene Rotunda for left and right heart catheterization, which was  performed on May 02, 2008.  This showed severe aortic stenosis with 2-  vessel coronary artery disease, which involved the LAD and the right  coronary arteries.  Left ventricular ejection fraction was approximately  45%.  Dr. Morton Peters saw the patient on two occasions in the office and  reviewed the results of her cardiac catheterization.  He felt that her  best course of action would be to proceed with aortic valve replacement  and two bypasses.  She had a preoperative CT angiogram to assess her  ascending aorta, and this showed a 4 to 4.5 cm ascending aorta with no  true fusiform aneurysm.  She also had vein mapping performed due to a  history of DVT and vein mapping showed adequate conduit.  Pre-CABG  Dopplers showed no significant coronary artery disease and ABIs were 0.6  on the right and 0.8 on the left.  All risks, benefits and alternatives  to surgery were explained  to the patient and consent was obtained.   HOSPITAL COURSE:  [name] [name] was admitted to [facility] on May 25, 2008 and underwent CABG x2 as well as AVR as described in detail and  performed by Dr. Morton Peters.  She tolerated the procedure well and was  transferred to the SICU.  Her postoperative course was complicated by  hypoxia, which led to ventilator dependent respiratory failure.  She was  initially treated with aggressive diuresis and pulmonary toilet measures  without significant improvement in her pulmonary status.  She continued  to remain hypoxic and 10 days postoperatively after showing no  significant improvement in her weaning parameters, it was felt that she  should undergo a tracheostomy.  She was returned to the operating room  on June 05, 2008, and underwent placement of a #8 Shiley  tracheostomy  tube.  Also at the time, a left chest tube was placed for a persistent  left pleural effusion and bronchoscopy was performed for airway  secretions.  She developed a leucocytosis with purulent production, and  a sputum culture was positive for Enterobacter.  She was stated on  empiric antibiotic coverage with [place].  She completed the 10-day  course of Fortaz.  However, upon reculture she was found to have  persistent Enterobacter, which was resistant to [employer].  She was  therefore started on Primaxin and subsequently has completed a 10-day  course.  Her followup cultures have been negative and her sputum has  resolved.   During her ICU stay she also developed atrial fibrillation and was  started on IV amiodarone.  This was discontinued in order to prevent  further pulmonary issues.  She was subsequently switched to digoxin and  Cardizem.  She has been maintaining sinus rhythm since her initial  conversion.  She has been volume overloaded throughout her admission and  has been diuresed.  She was started on tube feeds for nutritional  support during her tenure on the ventilator.  She was also transfused  with packed red blood cells for acute blood loss anemia.  After her  tracheostomy, she slowly began to improve from a pulmonary standpoint.  She was continued on aggressive pulmonary toilet measures and was slowly  weaned from the ventilator per the CCM Protocol.  She was weaned down to  trach collar, then a Passy-Muir valve was placed.  She has tolerated  this well, and over the course of the past week, her trach has been  slowly downsized.  She was ultimately decannulated completely on June 27, 2008.  Thus far, she has tolerated decannulation well.  She is  maintaining stable O2 saturations.  She is running greater than 90% on  room air.  Site around her tach stoma is clean, dry and healing well.  She was initially considered for PEG placement while still ventilator   dependent.  However, once she was able to be weaned, she had a repeat  speech pathology evaluation and was slowly started on an oral diet.  She  has continued with aspiration precautions and presently has been  upgraded to a dysphagia 3 diet with thin liquids.  She appears to be  tolerating this well without evidence of dysphagia or aspiration.  As  mentioned above, she has completed a 10-day course of Primaxin for  recurrent Enterobacter.  She has been working with PT, OT and cardiac  rehabilitation phase 1 on reconditioning.  Her surgical incisions are  all healing well.   Her  most recent laboratories show a hemoglobin of 11, hematocrit 34,  platelets 223, white count 9.6.  Sodium 139, potassium 3.4, BUN 11,  creatinine 0.72.   She has continued to progress and it is felt that she will soon be ready  for discharge from an acute care setting.  Currently case management is  working with her on rehabilitation placement, either [facility] versus short-term SNF.  She is felt to be medically ready  at this time once a bed is available for transfer.   DISCHARGE MEDICATIONS:  Are as follows:  1. Lanoxin 0.125 mg daily.  2. Cardizem 60 mg every 8 hours.  3. Lasix 20 mg b.i.d.  4. Zantac 150 mg b.i.d.  5. Crestor 10 mg nightly.  6. Lactinex 1 packet every 12 hours.  7. Lopressor 50 mg b.i.d.  8. Lovenox 40 mg subcu daily.  9. Potassium 40 mEq daily.  10.Aspirin 325 mg daily.  11.Neurontin 300 mg b.i.d.  12.Oxycodone 5 to 10 mg every 3 to 4 hours p.r.n. for pain.   DISCHARGE INSTRUCTIONS:  She is asked to refrain from heavy lifting or  strenuous activity, and may continue with sternal precautions.  She will  continue with physical therapy and occupational therapy as indicated.  Wound care:  She may shower daily and clean her incisions with soap and  water.  Her trach stoma area is to be cleaned daily and kept covered.   DIET:  She will continue a dysphagia 3 diet  with thin liquids.   FOLLOWUP:  Post discharge she will need to make an appointment to see  Dr. Antoine Poche at [facility] for cardiology followup.  She will  also need to see Dr. Morton Peters within 3 weeks of discharge with a chest  x-ray from [facility].  In the interim, if she experiences  any problems or any questions arise, she may contact our office.      Coral Ceo, P.A.      Kerin Perna, M.D.  Electronically Signed    GC/MEDQ  D:  06/28/2008  T:  06/28/2008  Job:  [id number]   cc:   Rollene Rotunda, MD, [facility]  Bethann Punches

## 2011-05-13 NOTE — Cardiovascular Report (Signed)
NAME:  WINNELL, [name] [name] NO.:  [id number]   MEDICAL RECORD NO.:  [id number]          PATIENT TYPE:  OIB   LOCATION:  2899                         FACILITY:  [facility]   PHYSICIAN:  Pricilla Riffle, MD, FACCDATE OF BIRTH:  [birth year]   DATE OF PROCEDURE:  DATE OF DISCHARGE:  08/24/2009                            CARDIAC CATHETERIZATION   IDENTIFICATION:  Ms. [name] [name] is a 70 year old with atrial fibrillation,  planned for cardioversion.   The patient was anesthetized for anesthesia with IV propofol.  With the  pads in the AP position, she was shocked with 200 joules synchronized  biphasic energy.  This was unsuccessful.  A repeat attempt with pressure  on the chest to compress was also unsuccessful (again with 200 joules).  The pads were moved to the AP position.  Again with 200 joules  synchronized biphasic energy, an attempt was made to cardiovert.  This  was unsuccessful.  Procedure was aborted.  A 12-lead EKG done.  Procedure without complication.      Pricilla Riffle, MD, [facility]  Electronically Signed     PVR/MEDQ  D:  08/24/2009  T:  08/25/2009  Job:  [id number] [id number]

## 2011-05-13 NOTE — Op Note (Signed)
NAME:  [name] [name], JETTER.:  [id number]   MEDICAL RECORD NO.:  [id number]           PATIENT TYPE:   LOCATION:                                 FACILITY:   PHYSICIAN:  Kerin Perna, M.D.  DATE OF BIRTH:  05-02-1941   DATE OF PROCEDURE:  DATE OF DISCHARGE:                               OPERATIVE REPORT   OPERATION:  1. Placement of tracheostomy (#8 Shiley).  2. Placement of left pleural chest tube for drainage of pleural      effusion.  3. Flexible bronchoscopy for airway secretions.   SURGEON:  Kerin Perna, MD.   ANESTHESIA:  General.   PREOPERATIVE DIAGNOSES:  Postoperative acute respiratory failure status  post aortic valve replacement/coronary artery bypass graft, left pleural  effusion, thick airway secretions.   POSTOPERATIVE DIAGNOSES:  Postoperative acute respiratory failure status  post aortic valve replacement/coronary artery bypass graft, left pleural  effusion, thick airway secretions.   PROCEDURE:  The patient is a 70 year old female who is morbidly obese  who underwent AVR/CABG approximately 10 days previously.  She has not  been extubated due to low oxygen saturations and suboptimal airway  mechanics.  After showing no significant improvement in weaning  parameters, a tracheostomy was felt to be indicated to help her wean  from the ventilator and to maintaining adequate pulmonary toilet.  Prior  to surgery, I discussed the situation with her daughter, [name] [name], and  obtained the informed consent.  I also discussed the need to perform a  left chest tube to drain a left moderate pleural effusion to optimize  her lung function.  Informed consent was obtained.   PROCEDURE:  The patient was brought directly from the ICU to the OR  where she was placed supine on the operating table.  The neck and chest  were prepped and draped initially with Betadine.  A small incision was  made above the sternal notch, and electrocautery was carried down with  dissection to the trachea.  A #8 Shiley tracheal appliance was prepared.  The trachea was opened with an incision, and the tracheal ring above was  divided to allow an adequate opening.  This was gently dilated with a  dilator as the ET tube was withdrawn.  A #8 Shiley tracheostomy was then  inserted into the trachea, and the  inner cannula was placed and the  cuff was inflated.  There was end-tidal CO2 return to the ventilator  circuit.  There was no significant bleeding.  The tracheostomy was  secured to the skin with 2-0 silk sutures.  A separate strap around the  neck was also later applied.   Through the tracheostomy, a fiberoptic bronchoscope was placed and  several thick secretions were removed.  This was performed due to high  airway pressures and some difficulty with ventilation.  This  bronchoscopy treatment improved airway mechanics and lowered her airway  pressure.  The findings of bronchoscopy were some mucoid, mildly bloody  secretions which were easily removed.  There is no significant pathology  of the distal trachea, carina, or proximal mainstem bronchi.  Next, the patient was rolled to expose the left chest with a blanket  roll, and the left chest was prepped and draped as a sterile field as  the left breast was elevated.  A small incision was made to the fifth  interspace.  The pleural space was entered.  A 20-French chest tube was  placed posteriorly and immediately 300 mL of serosanguineous fluid was  removed.  The chest tube was secured to the skin with 2 silk sutures  connected to underwater seal Pleur-Evac drainage.  A sterile dressing  was applied.   The patient then returned to the ICU on the ventilator with Anesthesia  Transport Team.      Kerin Perna, M.D.  Electronically Signed     PV/MEDQ  D:  06/05/2008  T:  06/06/2008  Job:  [id number]

## 2011-05-13 NOTE — Assessment & Plan Note (Signed)
OFFICE VISIT   FOY, MUNGIA  DOB:  1941-03-04                                        September 15, 2008  CHART #:  [id number]   CURRENT PROBLEMS:  1. Status post aortic valve replacement and coronary artery bypass      graft in May 2009 for critical aortic stenosis and 2-vessel      coronary artery disease.  2. Postoperative gram-negative pneumonia requiring tracheostomy and      long-term ventilator wean.  3. Morbid obesity.  4. Mild cellulitis at the leg vein harvest site.   PRESENT ILLNESS:  The patient returns for a check of her leg after a  course of oral antibiotics for mild cellulitis at the leg incision.  She  has been doing well.  The incision has now completely healed and there  is no evidence of cellulitis.   PHYSICAL EXAMINATION:  VITAL SIGNS:  Blood pressure 130/80, pulse 70,  respirations 18, and saturation 95%.  GENERAL:  She is alert and oriented.  LUNGS:  Breath sounds are clear.  The sternum is well healed.  EXTREMITIES:  The leg incisions are well healed.   PLAN:  The patient returned to the care of Dr. Hyacinth Meeker in [place] and  Dr. Antoine Poche.  I did provide her with refills for several prescriptions,  which will run out including Cardizem, Lasix, Crestor, Lopressor, and  Neurontin.  She will return here as needed.   Kerin Perna, M.D.  Electronically Signed   PV/MEDQ  D:  09/15/2008  T:  09/15/2008  Job:  [id number]

## 2011-05-13 NOTE — Consult Note (Signed)
NEW PATIENT CONSULTATION   [name] [name], [name] [name]  DOB:  11-08-1941                                        May 10, 2008  CHART #:  [id number]   REFERRING PHYSICIAN:  Rollene Rotunda, M.D.   PRIMARY CARE PHYSICIAN:  Bethann Punches, MD at the [facility] in  [place].   REASON FOR CONSULTATION:  Critical aortic stenosis with moderate two-  vessel coronary artery disease.   CHIEF COMPLAINT:  Weakness and shortness of breath.   HISTORY OF PRESENT ILLNESS:  I was asked to evaluate this 70 year old  obese white female for potential aortic valve replacement and coronary  bypass surgery for treatment of recently diagnosed severe aortic  stenosis and moderate two-vessel coronary artery disease.  The patient  has had a known cardiac murmur followed by Dr. Hyacinth Meeker for the past 4-5  years with serial 2-D echos.  The most recent echo performed showed an  increase in transvalvular gradient, associated with a decreased aortic  valve area to 0.8 with a peak gradient of 87 mmHg.  The patient is known  to have a heavily calcified bicuspid valve with concentric left  ventricular hypertrophy and preserved global LV systolic function.  Pulmonary pressures on the most recent echo were somewhat elevated at 45  mmHg, PA systolic pressure.  There is mild to moderate MR as well on the  echo report.  The patient was subsequently referred to Dr. Rollene Rotunda for left and right heart cath which was performed on May 02, 2008.  This demonstrated severe aortic stenosis with a calculated area  of 0.75.  PA pressures were 35/15, and the gradient across the aortic  valve by cath was 50 mmHg.  There is a 60% stenosis at the LAD and a 60%  stenosis at the origin of the posterior descending.  EF was 60%.  The  patient now presents for evaluation for aortic valve replacement with  combined coronary bypass grafting.  The patient has been in sinus  rhythm.  There is no clear history of chest pain,  presyncope or syncope.  She does have progressive dyspnea on exertion and gets short of breath  and weak just walking across the room.   PAST MEDICAL HISTORY:  1. Hypertension.  2. Osteoarthritis.  3. Aortic stenosis.  4. GERD.  5. Status post breast biopsy for benign disease.  6. History of phlebitis of the right leg following childbirth.  7. ALLERGY TO SULFA.   HOME MEDICATIONS:  1. Bisoprolol 5 mg daily.  2. Potassium 20 mEq daily.  3. Lisinopril 20 mg daily.  4. Nabutametone 750 mg b.i.d. for arthritis.  5. Neurontin 300 mg b.i.d.  6. Nifedipine ER 30 mg daily.  7. Lasix 20 mg daily.  8. Zocor 40 mg nightly.   SOCIAL HISTORY:  The patient is a homemaker and retired from working in  the [employer]  She is right-hand dominant.  She does not smoke or  use alcohol.  She has seasonal allergies.   FAMILY HISTORY:  Negative for cardiac valve disease.  Negative for  thoracic or abdominal aneurysm disease.  Positive for coronary disease  in her mother who died following an MI.   REVIEW OF SYSTEMS:  CONSTITUTIONAL:  Positive for chronic obesity with  weight currently around 300 pounds.  HEENT:  Positive for a total  dental extraction with upper and lower  dental plates.  She denies dysphagia, difficulty swallowing.  Thoracic  view is negative for history of abnormal chest x-ray or thoracic trauma.  She does have seasonal allergies with some postnasal drip and some  productive cough intermittently.  CARDIAC:  Positive for known coronary disease, aortic stenosis and left  ventricle hypertrophy.  ENDOCRINE:  Negative for diabetes or thyroid  disease.  GI:  Negative for hepatitis, jaundice, blood per rectum or gastric ulcer  disease.  UROLOGIC:  Negative for kidney stones or hematuria.  VASCULAR:  Positive for history of phlebitis in her right greater than  left leg with superficial varicosities currently.  She denies  claudication.  She denies TIA or mini-stroke.  NEUROLOGIC:   Negative for seizure or stroke.  No history of head trauma  or concussion.   PHYSICAL EXAMINATION:  VITAL SIGNS:  She is 5 feet 7 and weighs 305  pounds.  Blood pressure 160/100, pulse 80, respirations 18, saturation  92% on room air.  GENERAL APPEARANCE:  An anxious middle-aged obese female accompanied by  her sister-in-law.  She is in no distress.  HEENT:  Normocephalic.  She is a edentulous.  NECK:  Without JVD, mass or carotid bruit.  LYMPHATICS:  No palpable adenopathy of the supraclavicular fossa or  cervical neck area.  LUNGS:  Breath sounds are clear and equal.  CARDIAC:  Reveals a high pitched grade 3/6 systolic ejection murmur.  There is no S3 gallop, murmur or aortic insufficiency.  ABDOMEN:  Obese, soft, nontender.  EXTREMITIES:  Reveal 1-2+ ankle edema with superficial varicosities of  both legs, right greater than left.  There is no palpable pedal pulses,  but her feet are warm.  Both radial and ulnar pulses are palpable  bilaterally.  NEUROLOGIC:  Intact.   LABORATORY DATA:  Her 2-D echo and cath films are reviewed.  She has  severe aortic stenosis with a peak gradient of 50-70 mmHg and a valve  area of 0.7 to 0.8.  She has moderate disease of the LAD and distal  right coronary arteries with preserved LV function without left  ventricular hypertrophy.  Her ascending aorta may be dilated as noted  from the root injection on the ventriculogram and will need to be  assessed with a CT scan.   Spirometry in the office today indicates a FEV-1 of 1.8 with FEC of 1.9,  restrictive disease due to her obesity.   IMPRESSION AND PLAN:  The patient would benefit from aortic valve  replacement with a tissue valve and bypass surgery to the left anterior  descending and right coronary.  We will probably favor using the mammary  artery and a radial artery graft due to the chronic venous disease or  her lower extremities, but we will proceed with a vein mapping study.  I  will  order a CT angiogram to assess her ascending aorta which may be  aneurysmal, associated with her bicuspid aortic stenosis and  hypertension.   She will return to the office after CT angiogram and pre coronary artery  bypass grafting Doppler studies and vein mapping to discuss aortic valve  replacement with combined coronary artery bypass grafting later this  month.  She will continue her current medications.   Kerin Perna, M.D.  Electronically Signed   PV/MEDQ  D:  05/10/2008  T:  05/10/2008  Job:  [id number]   cc:   Rollene Rotunda, MD, [facility]  Bethann Punches, MD
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
## 2011-05-13 NOTE — Assessment & Plan Note (Signed)
OFFICE VISIT   MASSIAH, LONGANECKER  DOB:  1941-07-15                                        July 21, 2008  CHART #:  [id number]   CURRENT PROBLEMS:  1. Status post AVR/CABG x2 for critical aortic stenosis and coronary      artery disease on May 25, 2008.  2. Postoperative Gram-negative pneumonia requiring tracheostomy and      long-term ventilator wean.  3. Morbid obesity.  4. Postoperative left forearm and left hand neuritic pain from      probable brachial plexus stretch injury from sternotomy.  5. Deconditioning.   PRESENT ILLNESS:  The patient returns for her first office visit after  undergoing an AVR/CABG.  She had critical aortic stenosis and two-vessel  disease, and is morbidly obese.  She developed respiratory insufficiency  postoperatively from hypoxia, and then developed Gram-negative pneumonia  for which she received tracheostomy and then 3 weeks of antibiotics and  eventually weaned from the ventilator and decannulation of the trach  before she was discharged to home.   Her discharge medications include Lanoxin and Cardizem for postoperative  AFib, Lasix, Zantac, Crestor, Lopressor 50 b.i.d., aspirin, Neurontin,  and oxycodone.   She has done well at home without recurrent CHF or angina and the  surgical incisions are healing well.  OT and PT are following her at  home.  She has an appointment to see Dr. Antoine Poche in 2-3 weeks.  She has  not resumed driving.   PHYSICAL EXAMINATION:  VITAL SIGNS:  Blood pressure 140/80, pulse 70,  respirations 18, and saturation 92% on room air.  GENERAL:  She is alert and oriented.  LUNGS:  Breath sounds are clear and equal.  The sternum is stable and  well-healed.  CARDIAC:  Cardiac rhythm is regular and there is a soft flow murmur to  the bioprosthetic aortic valve.  There is no murmur of aortic  insufficiency.  EXTREMITIES:  The right leg incision at the saphenous vein harvest site  is well-healed.  She  has baseline swelling of her pretibial leg tissue  and ankles.   A PA and lateral chest x-ray taken today, demonstrates clear lung fields  without pleural effusion and stable cardiac silhouette was well aligned  and intact sternal wires.   IMPRESSION AND PLAN:  The patient is still weak and deconditioned.  She  has not ready to resume driving.  She will continue her current  medications.  I have provided her with some more pain medicine for  mainly pain in her left hand.  We will arrange for her to be seen by her  orthopedic surgeon in [place] (Dr. Gerrit Heck) and I will plan on seeing  her back 1 month for a followup.   Kerin Perna, M.D.  Electronically Signed   PV/MEDQ  D:  07/21/2008  T:  07/21/2008  Job:  [id number]   cc:   Garvin Fila, MD, [facility]
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
## 2011-05-13 NOTE — Assessment & Plan Note (Signed)
[facility]                            CARDIOLOGY OFFICE NOTE   NAME:[name] [name], [name] [name]                       MRN:          [id number]  DATE:10/27/2008                            DOB:          [birth year]    PRIMARY CARE PHYSICIAN:  Bethann Punches, MD   REASON FOR PRESENTATION:  Evaluate the patient's aortic valve  replacement and CABG.   HISTORY OF PRESENT ILLNESS:  The patient is a very pleasant 70 year old  who is status post aortic valve replacement and CABG.  She had a  prolonged hospitalization and was slowly recovering from this.  She is  now living in a trailer in between a brother and her sister.  She gets  around either holding onto a car when she shopping or riding in a  scooter.  With this level of activity, she does not get any significant  dyspnea, but if she moves faster than that she would get short of  breath.  She does not have any resting shortness of breath.  She denies  any PND or orthopnea.  She is not having any chest discomfort, neck, or  arm discomfort.  She is having progressive lower extremity swelling.  She drinks quite a bit of fluid per her description.  She does not  really avoid salt.  She does not really keep her feet up though she has  an easy chair.  Dr. Hyacinth Meeker was trying to get her enrolled in a cardiac  rehab which she is a little bit leery of this.   PAST MEDICAL HISTORY:  1. Severe aortic stenosis, status post aortic valve replacement      [facility] pericardial valve), CABG (LIMA to the LAD, SVG to      the RCA).  2. Postoperative Gram-negative pneumonia.  3. Ventilator dependent respiratory failure.  4. Morbid obesity.  5. Probable postoperative brachial plexus stretch with injury and      pain.  6. Postoperative atrial fibrillation.  7. Hypertension.  8. Osteoarthritis.  9. Gastroesophageal reflux disease.  10.Benign breast mass, status post biopsy.   ALLERGIES:  SULFA.   MEDICATIONS:  1. Aspirin 325  mg daily.  2. Vitamin B.  3. Lisinopril 20 mg daily.  4. Diltiazem 180 mg daily.  5. Crestor 10 mg daily.  6. Ranitidine 150 mg b.i.d.  7. Gabapentin.  8. Metoprolol 50 mg b.i.d.  9. Nabumetone 750 mg b.i.d.   REVIEW OF SYSTEMS:  As stated in the HPI and otherwise negative for  other systems.   PHYSICAL EXAMINATION:  GENERAL:  The patient is a pleasant and in no  distress.  VITAL SIGNS:  Her blood pressure 163/94, heart rate 93 and regular,  weight 308 pounds, body mass index 48.  HEENT:  Eyes are unremarkable, pupils equal, round, and reactive to  light, fundi not visualized, oral mucosa normal.  NECK:  Mild jugular distention of about 8 cm at 45 degrees, carotid  upstroke brisk and symmetrical.  No bruits, no thyromegaly.  LYMPHATICS:  No cervical, axillary, or inguinal adenopathy.  LUNGS:  Clear to auscultation bilaterally.  BACK:  No costovertebral tenderness.  CHEST:  Well-healed sternotomy scar.  HEART:  PMI not displaced or sustained, S1 and S2 within normal, no S3,  no S4, 2/6 apical systolic murmur radiating slightly out the aortic  outflow tract, no diastolic murmurs.  ABDOMEN:  Morbidly obese, positive bowel sounds, normal in frequency and  pitch, no bruits, no rebound, no guarding, no midline pulsatile mass, no  hepatomegaly, and no splenomegaly.  SKIN:  No rashes, no nodules.  EXTREMITIES:  2+ upper pulse, 1+ dorsalis pedis and post tibialis  bilaterally, severe lower extremity edema to the buttocks, chronic  venous stasis changes.  NEUROLOGIC:  Oriented to person, place, and time, cranial nerves II-XII  grossly intact, motor grossly intact.   ASSESSMENT AND PLAN:  1. Coronary artery disease.  The patient is having no ongoing angina.      No further cardiovascular testing is suggested.  We will continue      with risk reduction.  2. Aortic stenosis.  The patient had surgery and seems to have fewer      symptoms that she had prior to that.  She is making a slow  recovery      from this.  3. Lower extremity edema.  I am quite concerned about this as it seems      to be progressive and now she is keeping her feet down.  She is      drinking a lot of fluid.  She is now watching salt.  I am going to      go ahead and give her 40 mg of Lasix.  More important is to      restrict fluid and salt to keep her feet elevated and I talked to      her quite a bit about this.  I gave her 10 mEq a day of potassium.      I have given a written instructions to go to get a BMET done in 7-      10 days, Dr. Rondel Baton office.  4. Hypertension.  Her blood pressure is elevated today, but she takes      it at home and it is well controlled.  This followed by Dr. Hyacinth Meeker.  5. Morbid obesity.  She understands need to lose weight with diet and      exercise and she is in some kind of a program to try to achieve      this.  6. Followup.  I would like to see her in January 2010.  She is going      to see Dr. Hyacinth Meeker again in December 2009.    Rollene Rotunda, MD, [facility]  Electronically Signed   JH/MedQ  DD: 10/27/2008  DT: 10/28/2008  Job #: [id number]   cc:   Bethann Punches, MD

## 2011-05-13 NOTE — Op Note (Signed)
NAME:  [name] [name], [name] [name].:  [id number]   MEDICAL RECORD NO.:  [id number]          PATIENT TYPE:  INP   LOCATION:  2305                         FACILITY:  [facility]   PHYSICIAN:  Kerin Perna, M.D.  DATE OF BIRTH:  [birth year]   DATE OF PROCEDURE:  DATE OF DISCHARGE:                               OPERATIVE REPORT   OPERATION:  1. Aortic valve replacement using a Edwards Magna pericardial valve      (23-mm valve, model 3000, serial number [id number]).  2. Coronary bypass grafting x2 (left internal mammary artery LAD,      saphenous vein graft to distal RCA).  3. Endoscopic vein harvest of the right leg greater saphenous vein.   SURGEON:  Kerin Perna, MD.   ASSISTANTS:  1. Salvatore Decent. Cornelius Moras, MD.  2. Doree Fudge, PA.   ANESTHESIA:  General.   PREOPERATIVE DIAGNOSES:  Severe aortic stenosis and severe 2-vessel  coronary disease with class III congestive heart failure.   POSTOPERATIVE DIAGNOSES:  Severe aortic stenosis and severe 2-vessel  coronary disease with class III congestive heart failure.   INDICATIONS:  The patient is a 70 year old morbidly obese nonsmoker who  presents with progressive dyspnea on exertion and significantly  decreasing exercise tolerance over the past several weeks.  A 2-D echo  showed severe aortic stenosis with a calculated valve area of 0.8 and a  cardiac catheterization by Dr. Antoine Poche demonstrated the severe 2-vessel  coronary artery disease involving the LAD and right coronary arteries.  LVEF was approximately 45%, and she was felt to be a candidate for  aortic valve replacement and coronary bypass grafting.  I examined the  patient in the office on 2 occasions and reviewed the results of the  cardiac cath with the patient and her family.  I discussed the  indications and expected benefits of aortic valve replacement and  coronary artery bypass grafting for treatment of her aortic stenosis and  coronary artery disease.  I  reviewed the alternatives to surgery as  well.  I discussed with the patient the major issues of the operation  including the use of general anesthesia, the location of the surgical  incisions, the use of general anesthesia and cardiopulmonary bypass, and  the expected postoperative hospital recovery.  I discussed with the  patient the risks to her of coronary bypass surgery including the risks  of MI, CVA, bleeding, blood transfusion requirement, infection, and  death.  She understood that due to her obesity that she would be at  increased risk for postoperative respiratory complications including  prolonged ventilator dependence, aspiration pneumonia, and prolonged  oxygen dependence.  After reviewing these issues, she demonstrated her  understanding and agreed to proceed with the operation under what I felt  was an informed consent.   OPERATIVE FINDINGS:  The aortic valve was tricuspid with calcification,  stenosis, and insufficiency.  The coronaries had severe calcification.  The mammary artery was a good conduit.  The saphenous vein was dilated  with varicosities and was adequate, but suboptimal.  The patient did not  require any blood transfusion except for  a unit of platelets after  reversal of the heparin with protamine.   PROCEDURE:  The patient was brought to the operative room and placed on  the operating table where general anesthesia was induced under invasive  hemodynamic monitoring.  The chest, abdomen, and legs were prepped with  Betadine and draped as a sterile field.  A sternal incision was made,  and the saphenous vein was harvested endoscopically from the right  thigh.  The left internal mammary artery was harvested as a pedicle  graft from its origin at the subclavian vessels.  The sternal retractor  was placed using the deep blades.  The pericardium was opened and  suspended.  After the vein had been harvested and found to be adequate,  the patient was cannulated  through pursestrings in the ascending aorta  and right atrium.  The ACT was documented as being therapeutic after  administration of calculated heparin.  After going on bypass, the heart  was inspected and the ventricle was significantly enlarged.  The LAD and  distal RCA were heavily calcified, but found to be adequate targets for  grafting.  A left ventricular vent was placed via the right superior  pulmonary vein.  Cardioplegia catheters were placed for both antegrade  and retrograde cold blood cardioplegia.  The patient was cooled to 32  degrees.  The aortic cross-clamp was applied, and 1 L of cold blood  cardioplegia was administered with a good cardioplegic arrest and septal  temperature dropping less than 15 degrees.  Cardioplegia was then  delivered every 20 minutes or less using either the retrograde catheter,  direct perfusion of the coronary ostia, or through the vein graft access  route.   The distal coronary anastomoses were the performed.  The first distal  anastomosis was the distal RCA.  This is a heavily calcified vessel  along its entire length.  A reverse saphenous vein which was dilated and  somewhat thinned was sewn end to side with running 7-0 Prolene with some  difficulty due to the calcium in the vessel.  There was good flow  through the graft, however.  The second distal anastomosis was to the  mid LAD which was a 1.7-mm vessel with proximal 80% stenosis.  The left  IMA pedicle was brought through an opening created, and the left lateral  pericardium was brought down onto the LAD and sewn end to side with  running 8-0 Prolene.  There was good flow through the anastomosis after  briefly releasing the pedicle bulldog on the mammary pedicle.  The  pedicle was secured to the epicardium, and cardioplegia was redosed.   Attention was directed to the aortic valve.  A transverse aortotomy was  made just above the semitubular junction.  The aortic valve was  inspected  and found be calcified, thickened, trileaflet, and  insufficient.  The valve was excised, and the annulus debrided of  calcium.  The valve and outflow tract were irrigated with copious  amounts of cold saline.  The annulus was sized with 23-mm Magna  pericardial valve.  The valve was prepared according to protocol and  subannular 2-0 Ethicon pledgeted sutures were placed around the annulus  measuring 14 suture total.  After the valve was repaired, the sutures  were placed through the sewing ring of the valve.  The valve was seated  and sutures were tied.  The valve had good position, and there was no  obstruction of the coronary ostia and no evidence of spaces  for  potential perivalvular leak.  The aortotomy was then closed in 2 layers  using a running 4-0 Prolene.  Finally, the proximal vein anastomosis was  placed on the ascending aorta above the aortotomy using a 4.5-mm punch  and running 7-0 Prolene.  Air was vented from the coronaries from the  left side of the heart using the usual maneuvers prior to tying down the  final proximal anastomosis.  The cross-clamp was then removed.   The heart was cardioverted back to a regular rhythm.  Air was aspirated  from the vein graft.  The cardioplegia catheters were removed.  The  proximal and distal anastomoses and aortotomy were checked and found be  hemostatic.  Temporary pacing wires were placed.  The patient was  rewarmed to 37 degrees.  After low-dose dopamine and milrinone were  started, the patient was weaned off bypass after the ventilator had been  resumed and the lungs reinflated.  The 2-D echo post pump showed the new  prosthetic valve be functioning well.  LV function was adequate,  unchanged from preoperative echo, and there was a mild MR as previously  seen.  The patient was then administered protamine without adverse  reaction.  The cannula was removed.  The mediastinum was irrigated with  warm saline.  There was still some  coagulopathy, and the patient was  administered platelets to improve coagulation function.  The superior  pericardial fat was closed.  The leg incisions were irrigated and closed  in a standard fashion.  Two mediastinal and a left pleural chest tube  were placed and brought out through separate  incisions.  The sternum was closed with interrupted steel wire.  The  pectoralis fascia was closed with a running #1 Vicryl.  The subcutaneous  and skin layers were closed with running Vicryl and sterile dressings  were applied.  Total bypass time was 195 minutes with a cross-clamp time  of 145 minutes.      Kerin Perna, M.D.  Electronically Signed     PV/MEDQ  D:  05/25/2008  T:  05/26/2008  Job:  [id number]   cc:   Rollene Rotunda, MD,  [facility]

## 2011-05-13 NOTE — Cardiovascular Report (Signed)
NAME:  [name] [name], [name] [name] NO.:  [id number]   MEDICAL RECORD NO.:  [id number]          PATIENT TYPE:  OIB   LOCATION:  1963                         FACILITY:  [facility]   PHYSICIAN:  Rollene Rotunda, MD, FACCDATE OF BIRTH:  August 28, 1941   DATE OF PROCEDURE:  DATE OF DISCHARGE:                            CARDIAC CATHETERIZATION   PROCEDURE:  Left and right heart catheterization/coronary arteriography.   INDICATIONS:  The patient with aortic stenosis and severe dyspnea.   PROCEDURE NOTE:  Left heart catheterization performed via the right  femoral artery, right heart catheterization performed via the right  femoral vein.  Both vessels were cannulated using the anterior wall  puncture.  A #5-French arterial sheath and #7-French venous sheath were  inserted via the Seldinger technique.  Preformed Judkins and pigtail  catheter utilized.  Of note, I used a Judkins left 6 curve to cannulate  the left main.  We crossed the aortic valve with a straight wire using  the right coronary catheter and then exchanged with a long J exchange  wire for the pigtail.  The patient tolerated procedure well and he left  the lab in stable condition.   RESULTS:  Hemodynamics RA mean 8, RV 42/4, PA 34/11 with a mean of 23,  pulmonary wedge pressure mean 16, LV 196/24, AO 55/89, cardiac  output/cardiac index 6.2/2.6, aortic valve area 0.75, aortic valve index  0.31.  Coronaries of the left main was normal.  The LAD had moderate  proximal and mid calcification.  There was mid 60% stenosis at the  second diagonal followed by 50% distal stenosis.  The first diagonal was  small with ostial 40% stenosis.  Second diagonal was moderate sized with  proximal 30% stenosis.  The circumflex in the AV groove had diffuse  luminal irregularities.  Mid obtuse marginal was large with ostial 25%  stenosis.  Second mid obtuse marginal was moderate sized with proximal  25% stenosis.  Posterolateral was moderate sized  with luminal  irregularities.  The right coronary artery is a dominant vessel.  Proximal 25% stenosis.  There was mid 25% stenosis.  The PDA was long  with ostial 50-60% stenosis.   Left Ventriculogram:  Left ventriculogram obtained in the RAO  projection.  The EF was 65% with normal wall motion.   CONCLUSION:  Severe symptomatic aortic stenosis with moderate LAD and  right coronary stenosis.   PLAN:  I will consult Dr. Kathlee Nations Trigt to discuss valve replacement.  I would suggest bypass as well as we will discuss this further with him.      Rollene Rotunda, MD, [facility]  Electronically Signed     JH/MEDQ  D:  05/02/2008  T:  05/03/2008  Job:  [id number]   cc:   Bethann Punches

## 2011-05-13 NOTE — Assessment & Plan Note (Signed)
OFFICE VISIT   KENSI, KARR  DOB:  [birth year]                                        May 19, 2008  CHART #:  [id number]   CURRENT PROBLEMS:  1. Critical aortic stenosis with moderate two-vessel coronary artery      disease.  2. Obesity.  3. Hypertension.  4. GERD.   PRESENT ILLNESS:  Ms. Tzeng is a 70 year old obese white female with  bicuspid aortic stenosis who returns for further discussion of aortic  valve replacement with combined coronary artery bypass surgery.  Since  last week she underwent a CT angiogram due to concern over an ascending  aneurysm.  The ascending aorta measures 4 to 4.5 cm and is not a true  fusiform aneurysm.  Vein mapping was performed due to a history of DVT  at the time of her childbirth and vein mapping shows adequate conduit  both legs above the knee.  Pre-CABG Dopplers show no significant carotid  disease and her ABIs are 0.6 on the right and 0.8 on the left.  She is  anxious about the upcoming surgery and has been taking some Xanax that  apparently her husband has.  She denies any bronchitis or symptoms of  URI since her exam last week.  She remains on her medications as listed  previously including furosemide, lisinopril, simvastatin, nabumetone,  bisoprolol, and nifedipine.  She is allergic to SULFA.   EXAM:  Blood pressure 160/98, pulse 92, respirations 18, saturation 93%.  She is anxious but alert and oriented.  Cardiac exam is with regular  rhythm with a grade 3/6 systolic ejection murmur.  Breath sounds are  clear.  She has 1+ ankle edema with some venous stasis changes  bilaterally in the pretibial area.  Her neurologic exam is intact.   I reviewed the results of the previously mentioned laboratory tests.  She does not need a dental exam prior to elective valve surgery since  she is edentulous.  We will proceed with scheduling her surgery on May  28 which will entail AVR with a tissue valve and coronary  bypass surgery  using a mammary artery to LAD and a vein graft harvested endoscopically  to the posterior descending.  The ascending aorta will not need to be  replaced.  I reviewed the details of surgery with the patient including  the risks of pulmonary complications due to her obesity and she  understands and agrees to proceed with surgery.   Kerin Perna, M.D.  Electronically Signed   PV/MEDQ  D:  05/19/2008  T:  05/19/2008  Job:  [id number]
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
## 2011-06-06 ENCOUNTER — Telehealth: Payer: Self-pay | Admitting: Cardiology

## 2011-06-06 NOTE — Telephone Encounter (Signed)
Pt calling stating home health nurse told her to phone [place] regarding "cellulitis in legs"--pt states she fell and injured her back 3 wks ago and orthopod saw her and told her she had broken bone in back--she's c/o severe back pain, altho she has MS04 patches and oral pain medication to take she also wants to make an appoint with dr hochrein--advised, she needs to call PCP concerning back pain and transferred to appointment desk to make an appoint--pt agrees--also advised an urgent care is also an option--pt states she has no money to pay an urgent care --nt

## 2011-06-06 NOTE — Telephone Encounter (Signed)
Pt states home health nurse wants her to see dr hochrein asap for cellulitis, I spoke to the nurse and she thinks that's what it is, has edema even when elevating her legs and today legs started leaking asked if pcp should see her or dr Antoine Poche

## 2011-06-09 DIAGNOSIS — I5030 Unspecified diastolic (congestive) heart failure: Secondary | ICD-10-CM

## 2011-06-09 DIAGNOSIS — I059 Rheumatic mitral valve disease, unspecified: Secondary | ICD-10-CM

## 2011-06-10 ENCOUNTER — Inpatient Hospital Stay: Payer: Self-pay | Admitting: Internal Medicine

## 2011-06-10 DIAGNOSIS — I4891 Unspecified atrial fibrillation: Secondary | ICD-10-CM

## 2011-06-12 DIAGNOSIS — I5030 Unspecified diastolic (congestive) heart failure: Secondary | ICD-10-CM

## 2011-06-12 DIAGNOSIS — Z01810 Encounter for preprocedural cardiovascular examination: Secondary | ICD-10-CM

## 2011-06-17 LAB — PATHOLOGY REPORT

## 2011-06-19 ENCOUNTER — Encounter: Payer: Self-pay | Admitting: Cardiovascular Disease

## 2011-07-21 ENCOUNTER — Ambulatory Visit (INDEPENDENT_AMBULATORY_CARE_PROVIDER_SITE_OTHER): Payer: Medicare Other | Admitting: Cardiovascular Disease

## 2011-07-21 ENCOUNTER — Encounter: Payer: Self-pay | Admitting: Cardiovascular Disease

## 2011-07-21 DIAGNOSIS — I509 Heart failure, unspecified: Secondary | ICD-10-CM

## 2011-07-21 DIAGNOSIS — I5032 Chronic diastolic (congestive) heart failure: Secondary | ICD-10-CM

## 2011-07-21 DIAGNOSIS — E785 Hyperlipidemia, unspecified: Secondary | ICD-10-CM

## 2011-07-21 DIAGNOSIS — Z954 Presence of other heart-valve replacement: Secondary | ICD-10-CM

## 2011-07-21 DIAGNOSIS — I10 Essential (primary) hypertension: Secondary | ICD-10-CM

## 2011-07-21 DIAGNOSIS — I5042 Chronic combined systolic (congestive) and diastolic (congestive) heart failure: Secondary | ICD-10-CM

## 2011-07-21 DIAGNOSIS — I4891 Unspecified atrial fibrillation: Secondary | ICD-10-CM

## 2011-07-21 DIAGNOSIS — R609 Edema, unspecified: Secondary | ICD-10-CM

## 2011-07-21 NOTE — Progress Notes (Signed)
Patient ID: [name] [name], female    DOB: [birth year], 70 y.o.   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 S/p back surgery following a fall on recent admission.   She reports that she is doing well. Her back continues to bother her in the left SI joint region. Her edema is improved after TED hose and leg elevation, with gentle diuresis. Her leg strength is also slowly improving. No significant SOB, chest pain, or lightheadedness. No tachypalpitations noted with exertion.   EKG shows atrial fib with rate of 69 bpm, RBBB   Outpatient Encounter Prescriptions as of 07/21/2011  Medication Sig Dispense Refill  . aspirin 81 MG tablet Take 81 mg by mouth daily.        Marland Kitchen diltiazem (CARDIZEM CD) 180 MG 24 hr capsule Take 180 mg by mouth daily.        Marland Kitchen gabapentin (NEURONTIN) 300 MG capsule Take 300 mg by mouth 2 (two) times daily as needed.        Marland Kitchen HYDROcodone-acetaminophen (NORCO) 5-325 MG per tablet Take 1 tablet by mouth as directed.        . hydrOXYzine (ATARAX) 25 MG tablet Take 25 mg by mouth 3 (three) times daily as needed.        Marland Kitchen losartan (COZAAR) 100 MG tablet Take 100 mg by mouth daily.        . magnesium gluconate (MAGONATE) 500 MG tablet Take 500 mg by mouth daily.        . metolazone (ZAROXOLYN) 2.5 MG tablet Take 2.5 mg by mouth once a week. Every Monday am       . metoprolol (LOPRESSOR) 50 MG tablet Take 50 mg by mouth 2 (two) times daily.        . potassium chloride SA (K-DUR,KLOR-CON) 20 MEQ  tablet Take by mouth. 3 tablets daily       . ranitidine (ZANTAC) 150 MG capsule Take 150 mg by mouth 2 (two) times daily.        . sertraline (ZOLOFT) 50 MG tablet Take 50 mg by mouth daily.        . simvastatin (ZOCOR) 40 MG tablet Take 40 mg by mouth daily.        Marland Kitchen torsemide (DEMADEX) 20 MG tablet Take 20 mg by mouth 2 (two) times daily.        Marland Kitchen warfarin (COUMADIN) 5 MG tablet Take 5 mg by mouth daily. UAD          Review of Systems  HENT: Negative.   Eyes: Negative.   Respiratory: Negative.   Cardiovascular: Negative.   Gastrointestinal: Negative.   Musculoskeletal: Positive for gait problem.  Skin: Negative.   Neurological: Positive for weakness.  Hematological: Negative.   Psychiatric/Behavioral: Negative.   All other systems reviewed and are negative.    BP 135/85  Pulse 83  Ht 5\' 5"  (1.651 m)  Wt 298 lb (135.172 kg)  BMI 49.59 kg/m2  Physical Exam  Nursing note  and vitals reviewed. Constitutional: She is oriented to person, place, and time. She appears well-developed and well-nourished.  HENT:  Head: Normocephalic.  Nose: Nose normal.  Mouth/Throat: Oropharynx is clear and moist.  Eyes: Conjunctivae are normal. Pupils are equal, round, and reactive to light.  Neck: Normal range of motion. Neck supple. No JVD present.  Cardiovascular: Normal rate, regular rhythm, S1 normal, S2 normal, normal heart sounds and intact distal pulses.  Exam reveals no gallop and no friction rub.   No murmur heard.      Trace edema b/l to below the knees, skin discoloration  Pulmonary/Chest: Effort normal and breath sounds normal. No respiratory distress. She has no wheezes. She has no rales. She exhibits no tenderness.  Abdominal: Soft. Bowel sounds are normal. She exhibits no distension. There is no tenderness.  Musculoskeletal: Normal range of motion. She exhibits no edema and no tenderness.  Lymphadenopathy:    She has no cervical adenopathy.  Neurological: She is alert and  oriented to person, place, and time. Coordination normal.  Skin: Skin is warm and dry. No rash noted. No erythema.  Psychiatric: She has a normal mood and affect. Her behavior is normal. Judgment and thought content normal.         Assessment and Plan

## 2011-07-21 NOTE — Assessment & Plan Note (Signed)
We have encouraged continued exercise, careful diet management in an effort to lose weight. 

## 2011-07-21 NOTE — Patient Instructions (Signed)
You are doing well. No medication changes were made. Please call us if you have new issues that need to be addressed before your next appt.  We will call you for a follow up Appt. In 6 months  

## 2011-07-21 NOTE — Assessment & Plan Note (Signed)
Recent edema in the hospital was from diastolic dysfunction and venous insufficiency. Edema has improved with ted hose and leg elevation.

## 2011-07-21 NOTE — Assessment & Plan Note (Addendum)
Recent acute exacerbation of diastolic CHF in setting of rapid atrial fibrillation. Appears euvolemic currently on torsemide BID. She states her labs have been drawn twice since she has left the hospital, by [facility].

## 2011-07-21 NOTE — Assessment & Plan Note (Signed)
Blood pressure is well controlled on today's visit. No changes made to the medications. 

## 2011-07-21 NOTE — Assessment & Plan Note (Signed)
Rate controlled atrial fib on warfarin. She has no symptoms. Will continue currents meds. Mild AS, h/o aortic valve disease may predispose her to atrial fib.

## 2011-07-21 NOTE — Assessment & Plan Note (Signed)
Recent echo 6/ 2012 suggesting aortic valve prosthesis is working well.

## 2011-09-24 LAB — APTT
aPTT: 37
aPTT: 41 — ABNORMAL HIGH

## 2011-09-24 LAB — POCT I-STAT 3, ART BLOOD GAS (G3+)
Acid-Base Excess: 4 — ABNORMAL HIGH
Acid-Base Excess: 6 — ABNORMAL HIGH
Acid-base deficit: 1
Acid-base deficit: 2
Acid-base deficit: 4 — ABNORMAL HIGH
Acid-base deficit: 4 — ABNORMAL HIGH
Acid-base deficit: 4 — ABNORMAL HIGH
Acid-base deficit: 5 — ABNORMAL HIGH
Acid-base deficit: 6 — ABNORMAL HIGH
Bicarbonate: 21.5
Bicarbonate: 22.1
Bicarbonate: 22.3
Bicarbonate: 22.6
Bicarbonate: 23.1
Bicarbonate: 23.7
Bicarbonate: 24
Bicarbonate: 25.1 — ABNORMAL HIGH
Bicarbonate: 26 — ABNORMAL HIGH
Bicarbonate: 28.3 — ABNORMAL HIGH
Bicarbonate: 30.3 — ABNORMAL HIGH
O2 Saturation: 100
O2 Saturation: 89
O2 Saturation: 90
O2 Saturation: 90
O2 Saturation: 90
O2 Saturation: 91
O2 Saturation: 91
O2 Saturation: 92
O2 Saturation: 92
O2 Saturation: 93
O2 Saturation: 97
Operator id: 196291
Operator id: 203371
Operator id: 203371
Operator id: 222511
Operator id: 236041
Operator id: 299391
Operator id: 299391
Operator id: 305741
Operator id: 3402
Operator id: 3402
Operator id: 3402
Patient temperature: 35.4
Patient temperature: 36.3
Patient temperature: 37.1
Patient temperature: 37.7
Patient temperature: 37.8
Patient temperature: 37.8
Patient temperature: 38.1
TCO2: 23
TCO2: 24
TCO2: 24
TCO2: 24
TCO2: 25
TCO2: 25
TCO2: 25
TCO2: 26
TCO2: 27
TCO2: 30
TCO2: 32
pCO2 arterial: 38.2
pCO2 arterial: 40.3
pCO2 arterial: 40.8
pCO2 arterial: 42.1
pCO2 arterial: 44.2
pCO2 arterial: 44.5
pCO2 arterial: 45.7 — ABNORMAL HIGH
pCO2 arterial: 48.2 — ABNORMAL HIGH
pCO2 arterial: 49.4 — ABNORMAL HIGH
pCO2 arterial: 49.7 — ABNORMAL HIGH
pCO2 arterial: 51 — ABNORMAL HIGH
pH, Arterial: 7.236 — ABNORMAL LOW
pH, Arterial: 7.281 — ABNORMAL LOW
pH, Arterial: 7.282 — ABNORMAL LOW
pH, Arterial: 7.289 — ABNORMAL LOW
pH, Arterial: 7.311 — ABNORMAL LOW
pH, Arterial: 7.329 — ABNORMAL LOW
pH, Arterial: 7.363
pH, Arterial: 7.369
pH, Arterial: 7.384
pH, Arterial: 7.453 — ABNORMAL HIGH
pH, Arterial: 7.484 — ABNORMAL HIGH
pO2, Arterial: 289 — ABNORMAL HIGH
pO2, Arterial: 56 — ABNORMAL LOW
pO2, Arterial: 61 — ABNORMAL LOW
pO2, Arterial: 64 — ABNORMAL LOW
pO2, Arterial: 65 — ABNORMAL LOW
pO2, Arterial: 65 — ABNORMAL LOW
pO2, Arterial: 65 — ABNORMAL LOW
pO2, Arterial: 68 — ABNORMAL LOW
pO2, Arterial: 68 — ABNORMAL LOW
pO2, Arterial: 70 — ABNORMAL LOW
pO2, Arterial: 94

## 2011-09-24 LAB — COMPREHENSIVE METABOLIC PANEL
ALT: 19
AST: 25
Albumin: 3.6
Alkaline Phosphatase: 89
BUN: 19
CO2: 23
Calcium: 9.5
Chloride: 106
Creatinine, Ser: 0.74
GFR calc Af Amer: >60
GFR calc non Af Amer: >60
Glucose, Bld: 91
Potassium: 4.2
Sodium: 139
Total Bilirubin: 1.1
Total Protein: 6.7

## 2011-09-24 LAB — CBC
HCT: 29.8 — ABNORMAL LOW
HCT: 31.1 — ABNORMAL LOW
HCT: 31.8 — ABNORMAL LOW
HCT: 35 — ABNORMAL LOW
HCT: 35.1 — ABNORMAL LOW
HCT: 46
Hemoglobin: 10 — ABNORMAL LOW
Hemoglobin: 10.5 — ABNORMAL LOW
Hemoglobin: 10.5 — ABNORMAL LOW
Hemoglobin: 11.6 — ABNORMAL LOW
Hemoglobin: 11.7 — ABNORMAL LOW
Hemoglobin: 15.2 — ABNORMAL HIGH
Hemoglobin: 8.4 — ABNORMAL LOW
MCHC: 32.9
MCHC: 33
MCHC: 33.1
MCHC: 33.5
MCHC: 33.5
MCHC: 33.9
MCV: 81.6
MCV: 81.8
MCV: 81.9
MCV: 82.3
MCV: 82.4
MCV: 83.1
Platelets: 135 — ABNORMAL LOW
Platelets: 172
Platelets: 221
Platelets: 237
Platelets: 244
Platelets: 282
RBC: 3.05 — ABNORMAL LOW
RBC: 3.62 — ABNORMAL LOW
RBC: 3.82 — ABNORMAL LOW
RBC: 3.86 — ABNORMAL LOW
RBC: 4.2
RBC: 4.29
RBC: 5.61 — ABNORMAL HIGH
RDW: 15.3
RDW: 15.4
RDW: 15.4
RDW: 15.5
RDW: 15.6 — ABNORMAL HIGH
RDW: 15.7 — ABNORMAL HIGH
WBC: 12.6 — ABNORMAL HIGH
WBC: 17 — ABNORMAL HIGH
WBC: 18.5 — ABNORMAL HIGH
WBC: 19.4 — ABNORMAL HIGH
WBC: 23.6 — ABNORMAL HIGH
WBC: 31.4 — ABNORMAL HIGH

## 2011-09-24 LAB — BLOOD GAS, ARTERIAL
Acid-Base Excess: 3.4 — ABNORMAL HIGH
Acid-base deficit: 0.3
Bicarbonate: 23.6
Bicarbonate: 27.4 — ABNORMAL HIGH
Drawn by: 181601
FIO2: 0.21
FIO2: 0.9
MECHVT: 650
O2 Saturation: 91.3
O2 Saturation: 96.3
PEEP: 5
Patient temperature: 101
Patient temperature: 98.6
Pressure support: 10
RATE: 12
TCO2: 24.7
TCO2: 28.7
pCO2 arterial: 37.4
pCO2 arterial: 44.4
pH, Arterial: 7.414 — ABNORMAL HIGH
pH, Arterial: 7.416 — ABNORMAL HIGH
pO2, Arterial: 64.7 — ABNORMAL LOW
pO2, Arterial: 79.7 — ABNORMAL LOW

## 2011-09-24 LAB — ABO/RH: ABO/RH(D): O POS

## 2011-09-24 LAB — CREATININE, SERUM
Creatinine, Ser: 0.68
GFR calc Af Amer: >60
GFR calc non Af Amer: >60

## 2011-09-24 LAB — BASIC METABOLIC PANEL
BUN: 12
BUN: 13
BUN: 13
BUN: 18
CO2: 26
CO2: 26
CO2: 28
CO2: 31
Calcium: 8.3 — ABNORMAL LOW
Calcium: 8.3 — ABNORMAL LOW
Calcium: 8.3 — ABNORMAL LOW
Calcium: 8.7
Chloride: 102
Chloride: 106
Chloride: 108
Chloride: 98
Creatinine, Ser: 0.72
Creatinine, Ser: 0.83
Creatinine, Ser: 0.88
Creatinine, Ser: 1.1
GFR calc Af Amer: 60 — ABNORMAL LOW
GFR calc Af Amer: >60
GFR calc Af Amer: >60
GFR calc Af Amer: >60
GFR calc Af Amer: >60
GFR calc non Af Amer: 49 — ABNORMAL LOW
GFR calc non Af Amer: >60
GFR calc non Af Amer: >60
GFR calc non Af Amer: >60
GFR calc non Af Amer: >60
Glucose, Bld: 115 — ABNORMAL HIGH
Glucose, Bld: 128 — ABNORMAL HIGH
Glucose, Bld: 135 — ABNORMAL HIGH
Glucose, Bld: 152 — ABNORMAL HIGH
Glucose, Bld: 154 — ABNORMAL HIGH
Potassium: 2.9 — ABNORMAL LOW
Potassium: 3.1 — ABNORMAL LOW
Potassium: 3.1 — ABNORMAL LOW
Potassium: 4
Potassium: 4.2
Sodium: 137
Sodium: 137
Sodium: 140
Sodium: 140
Sodium: 140

## 2011-09-24 LAB — URINE MICROSCOPIC-ADD ON

## 2011-09-24 LAB — PREPARE PLATELET PHERESIS

## 2011-09-24 LAB — POCT I-STAT 4, (NA,K, GLUC, HGB,HCT)
Glucose, Bld: 110 — ABNORMAL HIGH
Glucose, Bld: 123 — ABNORMAL HIGH
Glucose, Bld: 133 — ABNORMAL HIGH
Glucose, Bld: 148 — ABNORMAL HIGH
Glucose, Bld: 151 — ABNORMAL HIGH
Glucose, Bld: 169 — ABNORMAL HIGH
HCT: 32 — ABNORMAL LOW
HCT: 32 — ABNORMAL LOW
HCT: 32 — ABNORMAL LOW
HCT: 34 — ABNORMAL LOW
HCT: 36
HCT: 44
Hemoglobin: 10.9 — ABNORMAL LOW
Hemoglobin: 10.9 — ABNORMAL LOW
Hemoglobin: 10.9 — ABNORMAL LOW
Hemoglobin: 11.6 — ABNORMAL LOW
Hemoglobin: 12.2
Hemoglobin: 15
Operator id: 305741
Operator id: 3402
Operator id: 3402
Operator id: 3402
Operator id: 3402
Operator id: 3402
Potassium: 3.3 — ABNORMAL LOW
Potassium: 3.5
Potassium: 3.5
Potassium: 3.6
Potassium: 3.8
Potassium: 3.8
Sodium: 136
Sodium: 138
Sodium: 139
Sodium: 139
Sodium: 141
Sodium: 142

## 2011-09-24 LAB — TYPE AND SCREEN
ABO/RH(D): O POS
Antibody Screen: NEGATIVE

## 2011-09-24 LAB — PHOSPHORUS
Phosphorus: 2.7
Phosphorus: 2.9
Phosphorus: 3
Phosphorus: 3.3

## 2011-09-24 LAB — URINALYSIS, ROUTINE W REFLEX MICROSCOPIC
Bilirubin Urine: NEGATIVE
Glucose, UA: NEGATIVE
Hgb urine dipstick: NEGATIVE
Ketones, ur: NEGATIVE
Leukocytes, UA: NEGATIVE
Nitrite: NEGATIVE
Protein, ur: 30 — AB
Specific Gravity, Urine: 1.027
Urobilinogen, UA: 1
pH: 6

## 2011-09-24 LAB — PROTIME-INR
INR: 1
INR: 1.5
Prothrombin Time: 13.2
Prothrombin Time: 18.6 — ABNORMAL HIGH

## 2011-09-24 LAB — MAGNESIUM
Magnesium: 1.7
Magnesium: 1.7
Magnesium: 1.7
Magnesium: 2.1
Magnesium: 2.3
Magnesium: 2.4

## 2011-09-24 LAB — POCT I-STAT, CHEM 8
BUN: 14
Calcium, Ion: 1.28
Chloride: 104
Creatinine, Ser: 0.7
Glucose, Bld: 137 — ABNORMAL HIGH
HCT: 35 — ABNORMAL LOW
Hemoglobin: 11.9 — ABNORMAL LOW
Potassium: 4.2
Sodium: 140
TCO2: 23

## 2011-09-24 LAB — HEMOGLOBIN AND HEMATOCRIT, BLOOD
HCT: 31.8 — ABNORMAL LOW
Hemoglobin: 10.6 — ABNORMAL LOW

## 2011-09-24 LAB — PREPARE RBC (CROSSMATCH)

## 2011-09-24 LAB — POCT I-STAT GLUCOSE
Glucose, Bld: 116 — ABNORMAL HIGH
Operator id: 3390

## 2011-09-24 LAB — PLATELET COUNT: Platelets: 177

## 2011-09-24 LAB — HEMOGLOBIN A1C
Hgb A1c MFr Bld: 5.4
Mean Plasma Glucose: 115

## 2011-09-25 LAB — BLOOD GAS, ARTERIAL
Acid-Base Excess: 5 — ABNORMAL HIGH
Acid-Base Excess: 5.4 — ABNORMAL HIGH
Acid-Base Excess: 5.6 — ABNORMAL HIGH
Acid-Base Excess: 6.7 — ABNORMAL HIGH
Acid-Base Excess: 7.6 — ABNORMAL HIGH
Acid-Base Excess: 7.8 — ABNORMAL HIGH
Acid-Base Excess: 8.4 — ABNORMAL HIGH
Acid-Base Excess: 8.6 — ABNORMAL HIGH
Acid-Base Excess: 10.8 — ABNORMAL HIGH
Bicarbonate: 29.7 — ABNORMAL HIGH
Bicarbonate: 29.8 — ABNORMAL HIGH
Bicarbonate: 29.9 — ABNORMAL HIGH
Bicarbonate: 30 — ABNORMAL HIGH
Bicarbonate: 32.1 — ABNORMAL HIGH
Bicarbonate: 32.4 — ABNORMAL HIGH
Bicarbonate: 33 — ABNORMAL HIGH
Bicarbonate: 33.3 — ABNORMAL HIGH
Bicarbonate: 35.3 — ABNORMAL HIGH
Drawn by: 275301
FIO2: 0.4
FIO2: 0.5
FIO2: 0.5
FIO2: 0.5
FIO2: 0.65
FIO2: 0.7
FIO2: 0.8
FIO2: 60
FIO2: 0.7
MECHVT: 500
MECHVT: 500
MECHVT: 500
MECHVT: 500
MECHVT: 500
MECHVT: 500
MECHVT: 500
O2 Saturation: 90.9
O2 Saturation: 91
O2 Saturation: 92.5
O2 Saturation: 92.8
O2 Saturation: 93.6
O2 Saturation: 94.2
O2 Saturation: 94.7
O2 Saturation: 94.9
O2 Saturation: 92.5
PEEP: 10
PEEP: 5
PEEP: 5
PEEP: 5
PEEP: 5
PEEP: 8
PEEP: 8
PEEP: 8
PEEP: 8
PIP: 25
Patient temperature: 98.6
Patient temperature: 98.6
Patient temperature: 98.6
Patient temperature: 98.6
Patient temperature: 98.6
Patient temperature: 98.6
Patient temperature: 98.6
Patient temperature: 99.1
Patient temperature: 99.8
Pressure control: 25
RATE: 14
RATE: 14
RATE: 14
RATE: 14
RATE: 14
RATE: 14
RATE: 14
RATE: 15
RATE: 14
TCO2: 31
TCO2: 31.1
TCO2: 31.2
TCO2: 31.4
TCO2: 33.6
TCO2: 34
TCO2: 34.5
TCO2: 34.8
TCO2: 36.8
pCO2 arterial: 37.3
pCO2 arterial: 44.2
pCO2 arterial: 46.6 — ABNORMAL HIGH
pCO2 arterial: 47.5 — ABNORMAL HIGH
pCO2 arterial: 51.2 — ABNORMAL HIGH
pCO2 arterial: 51.5 — ABNORMAL HIGH
pCO2 arterial: 51.6 — ABNORMAL HIGH
pCO2 arterial: 52.5 — ABNORMAL HIGH
pCO2 arterial: 52.3 — ABNORMAL HIGH
pH, Arterial: 7.384
pH, Arterial: 7.407 — ABNORMAL HIGH
pH, Arterial: 7.422 — ABNORMAL HIGH
pH, Arterial: 7.423 — ABNORMAL HIGH
pH, Arterial: 7.425 — ABNORMAL HIGH
pH, Arterial: 7.442 — ABNORMAL HIGH
pH, Arterial: 7.445 — ABNORMAL HIGH
pH, Arterial: 7.515 — ABNORMAL HIGH
pH, Arterial: 7.448 — ABNORMAL HIGH
pO2, Arterial: 58.7 — ABNORMAL LOW
pO2, Arterial: 60 — ABNORMAL LOW
pO2, Arterial: 65.2 — ABNORMAL LOW
pO2, Arterial: 65.7 — ABNORMAL LOW
pO2, Arterial: 66.3 — ABNORMAL LOW
pO2, Arterial: 68.8 — ABNORMAL LOW
pO2, Arterial: 75.7 — ABNORMAL LOW
pO2, Arterial: 75.9 — ABNORMAL LOW
pO2, Arterial: 67.1 — ABNORMAL LOW

## 2011-09-25 LAB — POCT I-STAT, CHEM 8
BUN: 28 — ABNORMAL HIGH
BUN: 30 — ABNORMAL HIGH
BUN: 34 — ABNORMAL HIGH
Calcium, Ion: 1.13
Calcium, Ion: 1.14
Calcium, Ion: 1.2
Chloride: 100
Chloride: 101
Chloride: 102
Creatinine, Ser: 1
Creatinine, Ser: 1
Creatinine, Ser: 1.2
Glucose, Bld: 113 — ABNORMAL HIGH
Glucose, Bld: 116 — ABNORMAL HIGH
Glucose, Bld: 97
HCT: 27 — ABNORMAL LOW
HCT: 27 — ABNORMAL LOW
HCT: 31 — ABNORMAL LOW
Hemoglobin: 10.5 — ABNORMAL LOW
Hemoglobin: 9.2 — ABNORMAL LOW
Hemoglobin: 9.2 — ABNORMAL LOW
Potassium: 2.8 — ABNORMAL LOW
Potassium: 2.8 — ABNORMAL LOW
Potassium: 3.6
Sodium: 142
Sodium: 142
Sodium: 144
TCO2: 30
TCO2: 31
TCO2: 34

## 2011-09-25 LAB — BASIC METABOLIC PANEL
BUN: 21
BUN: 23
BUN: 23
BUN: 25 — ABNORMAL HIGH
BUN: 26 — ABNORMAL HIGH
BUN: 27 — ABNORMAL HIGH
BUN: 11
BUN: 11
BUN: 12
BUN: 12
BUN: 12
BUN: 15
BUN: 17
BUN: 21
BUN: 9
BUN: 10
BUN: 11
BUN: 9
CO2: 30
CO2: 31
CO2: 31
CO2: 33 — ABNORMAL HIGH
CO2: 37 — ABNORMAL HIGH
CO2: 29
CO2: 30
CO2: 30
CO2: 35 — ABNORMAL HIGH
CO2: 36 — ABNORMAL HIGH
CO2: 36 — ABNORMAL HIGH
CO2: 26
CO2: 27
CO2: 27
Calcium: 8.4
Calcium: 8.5
Calcium: 8.5
Calcium: 8.6
Calcium: 8.8
Calcium: 8.8
Calcium: 8.7
Calcium: 8.7
Calcium: 8.8
Calcium: 9.1
Calcium: 9.1
Calcium: 9.3
Calcium: 9.4
Calcium: 9.4
Calcium: 9.4
Calcium: 8.7
Calcium: 8.7
Calcium: 9.2
Chloride: 100
Chloride: 100
Chloride: 100
Chloride: 101
Chloride: 107
Chloride: 108
Chloride: 99
Chloride: 100
Chloride: 101
Chloride: 102
Chloride: 104
Chloride: 104
Chloride: 95 — ABNORMAL LOW
Chloride: 96
Chloride: 98
Chloride: 99
Chloride: 105
Chloride: 106
Chloride: 106
Creatinine, Ser: 0.62
Creatinine, Ser: 0.62
Creatinine, Ser: 0.69
Creatinine, Ser: 0.78
Creatinine, Ser: 1.06
Creatinine, Ser: 0.54
Creatinine, Ser: 0.54
Creatinine, Ser: 0.55
Creatinine, Ser: 0.58
Creatinine, Ser: 0.58
Creatinine, Ser: 0.6
Creatinine, Ser: 0.67
Creatinine, Ser: 0.67
Creatinine, Ser: 0.68
Creatinine, Ser: 0.72
Creatinine, Ser: 0.74
Creatinine, Ser: 0.61
Creatinine, Ser: 0.62
Creatinine, Ser: 0.75
GFR calc Af Amer: >60
GFR calc Af Amer: >60
GFR calc Af Amer: >60
GFR calc Af Amer: >60
GFR calc Af Amer: >60
GFR calc Af Amer: >60
GFR calc Af Amer: >60
GFR calc Af Amer: >60
GFR calc Af Amer: >60
GFR calc Af Amer: >60
GFR calc Af Amer: >60
GFR calc Af Amer: >60
GFR calc Af Amer: >60
GFR calc Af Amer: >60
GFR calc Af Amer: >60
GFR calc Af Amer: >60
GFR calc Af Amer: >60
GFR calc non Af Amer: >60
GFR calc non Af Amer: >60
GFR calc non Af Amer: >60
GFR calc non Af Amer: >60
GFR calc non Af Amer: >60
GFR calc non Af Amer: >60
GFR calc non Af Amer: >60
GFR calc non Af Amer: >60
GFR calc non Af Amer: >60
GFR calc non Af Amer: >60
GFR calc non Af Amer: >60
GFR calc non Af Amer: >60
GFR calc non Af Amer: >60
GFR calc non Af Amer: >60
GFR calc non Af Amer: >60
GFR calc non Af Amer: >60
GFR calc non Af Amer: >60
Glucose, Bld: 108 — ABNORMAL HIGH
Glucose, Bld: 117 — ABNORMAL HIGH
Glucose, Bld: 118 — ABNORMAL HIGH
Glucose, Bld: 120 — ABNORMAL HIGH
Glucose, Bld: 125 — ABNORMAL HIGH
Glucose, Bld: 129 — ABNORMAL HIGH
Glucose, Bld: 108 — ABNORMAL HIGH
Glucose, Bld: 112 — ABNORMAL HIGH
Glucose, Bld: 113 — ABNORMAL HIGH
Glucose, Bld: 113 — ABNORMAL HIGH
Glucose, Bld: 115 — ABNORMAL HIGH
Glucose, Bld: 126 — ABNORMAL HIGH
Glucose, Bld: 223 — ABNORMAL HIGH
Glucose, Bld: 106 — ABNORMAL HIGH
Glucose, Bld: 110 — ABNORMAL HIGH
Glucose, Bld: 112 — ABNORMAL HIGH
Potassium: 3 — ABNORMAL LOW
Potassium: 3.2 — ABNORMAL LOW
Potassium: 3.6
Potassium: 3.7
Potassium: 4.4
Potassium: 4.5
Potassium: 4.6
Potassium: 4.6
Potassium: 3 — ABNORMAL LOW
Potassium: 3.3 — ABNORMAL LOW
Potassium: 3.4 — ABNORMAL LOW
Potassium: 3.4 — ABNORMAL LOW
Potassium: 3.6
Potassium: 3.9
Potassium: 5.2 — ABNORMAL HIGH
Potassium: 3.3 — ABNORMAL LOW
Potassium: 3.5
Potassium: 3.7
Sodium: 138
Sodium: 138
Sodium: 139
Sodium: 140
Sodium: 141
Sodium: 142
Sodium: 145
Sodium: 146 — ABNORMAL HIGH
Sodium: 147 — ABNORMAL HIGH
Sodium: 147 — ABNORMAL HIGH
Sodium: 136
Sodium: 139
Sodium: 139
Sodium: 141
Sodium: 141
Sodium: 141
Sodium: 142
Sodium: 137
Sodium: 137
Sodium: 141

## 2011-09-25 LAB — TSH: TSH: 1.509

## 2011-09-25 LAB — COMPREHENSIVE METABOLIC PANEL
ALT: 12
ALT: 18
ALT: 21
AST: 22
AST: 23
AST: 28
AST: 33
Albumin: 2.1 — ABNORMAL LOW
Albumin: 2.1 — ABNORMAL LOW
Albumin: 2.3 — ABNORMAL LOW
Alkaline Phosphatase: 73
Alkaline Phosphatase: 82
Alkaline Phosphatase: 92
BUN: 19
BUN: 27 — ABNORMAL HIGH
BUN: 28 — ABNORMAL HIGH
BUN: 32 — ABNORMAL HIGH
CO2: 32
CO2: 33 — ABNORMAL HIGH
CO2: 33 — ABNORMAL HIGH
CO2: 36 — ABNORMAL HIGH
Calcium: 8.5
Calcium: 8.5
Calcium: 8.6
Calcium: 8.8
Chloride: 102
Chloride: 103
Chloride: 96
Creatinine, Ser: 0.58
Creatinine, Ser: 0.7
Creatinine, Ser: 0.89
Creatinine, Ser: 0.97
GFR calc Af Amer: >60
GFR calc Af Amer: >60
GFR calc Af Amer: >60
GFR calc Af Amer: >60
GFR calc non Af Amer: 57 — ABNORMAL LOW
GFR calc non Af Amer: >60
GFR calc non Af Amer: >60
GFR calc non Af Amer: >60
Glucose, Bld: 112 — ABNORMAL HIGH
Glucose, Bld: 114 — ABNORMAL HIGH
Glucose, Bld: 237 — ABNORMAL HIGH
Potassium: 2.9 — ABNORMAL LOW
Potassium: 4.4
Potassium: 5.4 — ABNORMAL HIGH
Sodium: 134 — ABNORMAL LOW
Sodium: 146 — ABNORMAL HIGH
Sodium: 146 — ABNORMAL HIGH
Total Bilirubin: 0.7
Total Bilirubin: 1
Total Bilirubin: 1.2
Total Protein: 5 — ABNORMAL LOW
Total Protein: 5.8 — ABNORMAL LOW
Total Protein: 5.9 — ABNORMAL LOW

## 2011-09-25 LAB — MAGNESIUM
Magnesium: 2.2
Magnesium: 1.9

## 2011-09-25 LAB — CBC
HCT: 27 — ABNORMAL LOW
HCT: 27.1 — ABNORMAL LOW
HCT: 27.5 — ABNORMAL LOW
HCT: 27.6 — ABNORMAL LOW
HCT: 27.7 — ABNORMAL LOW
HCT: 27.7 — ABNORMAL LOW
HCT: 28.1 — ABNORMAL LOW
HCT: 28.4 — ABNORMAL LOW
HCT: 28.7 — ABNORMAL LOW
HCT: 28.9 — ABNORMAL LOW
HCT: 28.9 — ABNORMAL LOW
HCT: 27.8 — ABNORMAL LOW
HCT: 27.9 — ABNORMAL LOW
HCT: 28 — ABNORMAL LOW
HCT: 34.4 — ABNORMAL LOW
HCT: 37
HCT: 34.4 — ABNORMAL LOW
Hemoglobin: 8.8 — ABNORMAL LOW
Hemoglobin: 9.1 — ABNORMAL LOW
Hemoglobin: 9.1 — ABNORMAL LOW
Hemoglobin: 9.1 — ABNORMAL LOW
Hemoglobin: 9.2 — ABNORMAL LOW
Hemoglobin: 9.4 — ABNORMAL LOW
Hemoglobin: 9.4 — ABNORMAL LOW
Hemoglobin: 9.4 — ABNORMAL LOW
Hemoglobin: 9.5 — ABNORMAL LOW
Hemoglobin: 9.5 — ABNORMAL LOW
Hemoglobin: 9.5 — ABNORMAL LOW
Hemoglobin: 9.6 — ABNORMAL LOW
Hemoglobin: 9.7 — ABNORMAL LOW
Hemoglobin: 11.3 — ABNORMAL LOW
Hemoglobin: 12.1
Hemoglobin: 9 — ABNORMAL LOW
Hemoglobin: 9.1 — ABNORMAL LOW
Hemoglobin: 9.1 — ABNORMAL LOW
Hemoglobin: 11.4 — ABNORMAL LOW
MCHC: 32.8
MCHC: 33.1
MCHC: 33.1
MCHC: 33.4
MCHC: 33.7
MCHC: 33.7
MCHC: 34.2
MCHC: 34.4
MCHC: 34.5
MCHC: 32.3
MCHC: 32.6
MCHC: 32.7
MCHC: 32.8
MCHC: 32.9
MCHC: 32.9
MCHC: 33.1
MCV: 81.2
MCV: 81.4
MCV: 82.2
MCV: 82.3
MCV: 82.5
MCV: 82.9
MCV: 83.7
MCV: 83.8
MCV: 83.9
MCV: 84.8
MCV: 79.6
MCV: 79.8
MCV: 80
MCV: 80.2
MCV: 80.4
MCV: 80.9
MCV: 81.8
MCV: 79.9
Platelets: 150
Platelets: 213
Platelets: 265
Platelets: 277
Platelets: 280
Platelets: 305
Platelets: 309
Platelets: 334
Platelets: 341
Platelets: 223
Platelets: 272
Platelets: 274
Platelets: 279
Platelets: 288
Platelets: 291
Platelets: 303
Platelets: 219
RBC: 3.07 — ABNORMAL LOW
RBC: 3.21 — ABNORMAL LOW
RBC: 3.28 — ABNORMAL LOW
RBC: 3.33 — ABNORMAL LOW
RBC: 3.33 — ABNORMAL LOW
RBC: 3.35 — ABNORMAL LOW
RBC: 3.35 — ABNORMAL LOW
RBC: 3.36 — ABNORMAL LOW
RBC: 3.45 — ABNORMAL LOW
RBC: 3.46 — ABNORMAL LOW
RBC: 3.52 — ABNORMAL LOW
RBC: 3.42 — ABNORMAL LOW
RBC: 3.44 — ABNORMAL LOW
RBC: 3.48 — ABNORMAL LOW
RBC: 4.32
RBC: 4.61
RBC: 4.3
RDW: 15.9 — ABNORMAL HIGH
RDW: 16.1 — ABNORMAL HIGH
RDW: 16.2 — ABNORMAL HIGH
RDW: 16.2 — ABNORMAL HIGH
RDW: 16.3 — ABNORMAL HIGH
RDW: 16.4 — ABNORMAL HIGH
RDW: 16.4 — ABNORMAL HIGH
RDW: 16.6 — ABNORMAL HIGH
RDW: 16.6 — ABNORMAL HIGH
RDW: 16.8 — ABNORMAL HIGH
RDW: 17.1 — ABNORMAL HIGH
RDW: 18.1 — ABNORMAL HIGH
RDW: 18.7 — ABNORMAL HIGH
RDW: 18.6 — ABNORMAL HIGH
WBC: 11.6 — ABNORMAL HIGH
WBC: 11.8 — ABNORMAL HIGH
WBC: 12.8 — ABNORMAL HIGH
WBC: 12.9 — ABNORMAL HIGH
WBC: 13.3 — ABNORMAL HIGH
WBC: 13.5 — ABNORMAL HIGH
WBC: 14.3 — ABNORMAL HIGH
WBC: 14.5 — ABNORMAL HIGH
WBC: 14.5 — ABNORMAL HIGH
WBC: 14.8 — ABNORMAL HIGH
WBC: 15.3 — ABNORMAL HIGH
WBC: 17.2 — ABNORMAL HIGH
WBC: 10.8 — ABNORMAL HIGH
WBC: 12.2 — ABNORMAL HIGH
WBC: 12.6 — ABNORMAL HIGH
WBC: 8.4
WBC: 8.9
WBC: 9.2
WBC: 9.6
WBC: 9.5

## 2011-09-25 LAB — POCT I-STAT 3, ART BLOOD GAS (G3+)
Acid-Base Excess: 10 — ABNORMAL HIGH
Acid-Base Excess: 10 — ABNORMAL HIGH
Acid-Base Excess: 11 — ABNORMAL HIGH
Acid-Base Excess: 7 — ABNORMAL HIGH
Acid-Base Excess: 7 — ABNORMAL HIGH
Acid-Base Excess: 7 — ABNORMAL HIGH
Acid-Base Excess: 8 — ABNORMAL HIGH
Acid-Base Excess: 8 — ABNORMAL HIGH
Acid-Base Excess: 8 — ABNORMAL HIGH
Acid-Base Excess: 9 — ABNORMAL HIGH
Acid-Base Excess: 12 — ABNORMAL HIGH
Bicarbonate: 30.6 — ABNORMAL HIGH
Bicarbonate: 31.1 — ABNORMAL HIGH
Bicarbonate: 31.6 — ABNORMAL HIGH
Bicarbonate: 31.8 — ABNORMAL HIGH
Bicarbonate: 32.1 — ABNORMAL HIGH
Bicarbonate: 33.1 — ABNORMAL HIGH
Bicarbonate: 33.2 — ABNORMAL HIGH
Bicarbonate: 33.6 — ABNORMAL HIGH
Bicarbonate: 34.5 — ABNORMAL HIGH
Bicarbonate: 36.5 — ABNORMAL HIGH
Bicarbonate: 38.4 — ABNORMAL HIGH
O2 Saturation: 71
O2 Saturation: 88
O2 Saturation: 89
O2 Saturation: 89
O2 Saturation: 91
O2 Saturation: 92
O2 Saturation: 93
O2 Saturation: 94
O2 Saturation: 94
O2 Saturation: 95
O2 Saturation: 98
Operator id: 183861
Operator id: 196291
Operator id: 199821
Operator id: 273681
Operator id: 273681
Operator id: 277551
Operator id: 277551
Operator id: 288291
Operator id: 291371
Operator id: 305741
Operator id: 249101
Patient temperature: 100.2
Patient temperature: 100.6
Patient temperature: 36.8
Patient temperature: 36.9
Patient temperature: 37.2
Patient temperature: 98.5
Patient temperature: 98.6
Patient temperature: 98.6
Patient temperature: 98.8
Patient temperature: 99.5
Patient temperature: 98.6
TCO2: 32
TCO2: 32
TCO2: 33
TCO2: 33
TCO2: 33
TCO2: 34
TCO2: 34
TCO2: 35
TCO2: 36
TCO2: 38
TCO2: 40
pCO2 arterial: 38.6
pCO2 arterial: 40.2
pCO2 arterial: 40.8
pCO2 arterial: 43.6
pCO2 arterial: 43.8
pCO2 arterial: 44.4
pCO2 arterial: 44.9
pCO2 arterial: 48 — ABNORMAL HIGH
pCO2 arterial: 49.5 — ABNORMAL HIGH
pCO2 arterial: 56 — ABNORMAL HIGH
pCO2 arterial: 58.7
pH, Arterial: 7.424 — ABNORMAL HIGH
pH, Arterial: 7.433 — ABNORMAL HIGH
pH, Arterial: 7.436 — ABNORMAL HIGH
pH, Arterial: 7.467 — ABNORMAL HIGH
pH, Arterial: 7.47 — ABNORMAL HIGH
pH, Arterial: 7.483 — ABNORMAL HIGH
pH, Arterial: 7.493 — ABNORMAL HIGH
pH, Arterial: 7.494 — ABNORMAL HIGH
pH, Arterial: 7.515 — ABNORMAL HIGH
pH, Arterial: 7.526 — ABNORMAL HIGH
pH, Arterial: 7.424 — ABNORMAL HIGH
pO2, Arterial: 35 — CL
pO2, Arterial: 51 — ABNORMAL LOW
pO2, Arterial: 51 — ABNORMAL LOW
pO2, Arterial: 57 — ABNORMAL LOW
pO2, Arterial: 57 — ABNORMAL LOW
pO2, Arterial: 59 — ABNORMAL LOW
pO2, Arterial: 62 — ABNORMAL LOW
pO2, Arterial: 68 — ABNORMAL LOW
pO2, Arterial: 72 — ABNORMAL LOW
pO2, Arterial: 75 — ABNORMAL LOW
pO2, Arterial: 99

## 2011-09-25 LAB — URINALYSIS, ROUTINE W REFLEX MICROSCOPIC
Bilirubin Urine: NEGATIVE
Bilirubin Urine: NEGATIVE
Bilirubin Urine: NEGATIVE
Glucose, UA: NEGATIVE
Glucose, UA: NEGATIVE
Glucose, UA: NEGATIVE
Glucose, UA: NEGATIVE
Ketones, ur: NEGATIVE
Ketones, ur: NEGATIVE
Ketones, ur: NEGATIVE
Ketones, ur: 15 — AB
Nitrite: NEGATIVE
Nitrite: NEGATIVE
Nitrite: NEGATIVE
Nitrite: NEGATIVE
Protein, ur: 30 — AB
Protein, ur: NEGATIVE
Protein, ur: NEGATIVE
Protein, ur: 100 — AB
Specific Gravity, Urine: 1.014
Specific Gravity, Urine: 1.014
Specific Gravity, Urine: 1.019
Specific Gravity, Urine: 1.011
Urobilinogen, UA: 1
Urobilinogen, UA: 1
Urobilinogen, UA: 1
Urobilinogen, UA: 1
pH: 5.5
pH: 5.5
pH: 6
pH: 7.5

## 2011-09-25 LAB — URINE MICROSCOPIC-ADD ON

## 2011-09-25 LAB — URIC ACID: Uric Acid, Serum: 5.1

## 2011-09-25 LAB — TYPE AND SCREEN
ABO/RH(D): O POS
Antibody Screen: NEGATIVE

## 2011-09-25 LAB — CULTURE, RESPIRATORY W GRAM STAIN

## 2011-09-25 LAB — CLOSTRIDIUM DIFFICILE EIA
C difficile Toxins A+B, EIA: NEGATIVE
C difficile Toxins A+B, EIA: NEGATIVE
C difficile Toxins A+B, EIA: NEGATIVE

## 2011-09-25 LAB — URINE CULTURE
Colony Count: NO GROWTH
Culture: NO GROWTH
Special Requests: POSITIVE

## 2011-09-25 LAB — PHOSPHORUS
Phosphorus: 3
Phosphorus: 4.1

## 2011-09-25 LAB — POCT I-STAT 7, (LYTES, BLD GAS, ICA,H+H)
Acid-Base Excess: 14 — ABNORMAL HIGH
Bicarbonate: 39 — ABNORMAL HIGH
Calcium, Ion: 1.2
HCT: 28 — ABNORMAL LOW
Hemoglobin: 9.5 — ABNORMAL LOW
O2 Saturation: 97
Operator id: 160261
Potassium: 4
Sodium: 145
TCO2: 41
pCO2 arterial: 52.4 — ABNORMAL HIGH
pH, Arterial: 7.48 — ABNORMAL HIGH
pO2, Arterial: 83

## 2011-09-25 LAB — SEDIMENTATION RATE
Sed Rate: 58 — ABNORMAL HIGH
Sed Rate: 62 — ABNORMAL HIGH

## 2011-09-25 LAB — FOLATE: Folate: 9.4

## 2011-09-25 LAB — RETICULOCYTES
RBC.: 3.18 — ABNORMAL LOW
Retic Count, Absolute: 108.1
Retic Ct Pct: 3.4 — ABNORMAL HIGH

## 2011-09-25 LAB — IRON AND TIBC
Iron: 21 — ABNORMAL LOW
Saturation Ratios: 13 — ABNORMAL LOW
TIBC: 167 — ABNORMAL LOW
UIBC: 146

## 2011-09-25 LAB — FERRITIN: Ferritin: 179 (ref 10–291)

## 2011-09-25 LAB — B-NATRIURETIC PEPTIDE (CONVERTED LAB)
Pro B Natriuretic peptide (BNP): 225 — ABNORMAL HIGH
Pro B Natriuretic peptide (BNP): 263 — ABNORMAL HIGH
Pro B Natriuretic peptide (BNP): 309 — ABNORMAL HIGH
Pro B Natriuretic peptide (BNP): 478 — ABNORMAL HIGH

## 2011-09-25 LAB — T4: T4, Total: 8.2

## 2011-09-25 LAB — DIGOXIN LEVEL
Digoxin Level: <0.2 — ABNORMAL LOW
Digoxin Level: <0.2 — ABNORMAL LOW

## 2011-09-25 LAB — PROTIME-INR
INR: 1.2
Prothrombin Time: 15.1
Prothrombin Time: 15.3 — ABNORMAL HIGH

## 2011-09-25 LAB — POTASSIUM: Potassium: 3.4 — ABNORMAL LOW

## 2011-09-25 LAB — PREALBUMIN: Prealbumin: 9.1 — ABNORMAL LOW

## 2011-09-25 LAB — PREPARE RBC (CROSSMATCH)

## 2011-09-25 LAB — VITAMIN B12: Vitamin B-12: 213 (ref 211–911)

## 2011-09-25 LAB — T3 UPTAKE: T3 Uptake Ratio: 41.8 — ABNORMAL HIGH

## 2011-10-03 ENCOUNTER — Ambulatory Visit: Payer: Self-pay | Admitting: Internal Medicine

## 2011-12-24 ENCOUNTER — Inpatient Hospital Stay: Payer: Self-pay | Admitting: Internal Medicine

## 2011-12-24 DIAGNOSIS — I369 Nonrheumatic tricuspid valve disorder, unspecified: Secondary | ICD-10-CM

## 2011-12-25 DIAGNOSIS — R079 Chest pain, unspecified: Secondary | ICD-10-CM

## 2011-12-25 DIAGNOSIS — I4891 Unspecified atrial fibrillation: Secondary | ICD-10-CM

## 2011-12-25 DIAGNOSIS — I369 Nonrheumatic tricuspid valve disorder, unspecified: Secondary | ICD-10-CM

## 2011-12-27 DIAGNOSIS — R0602 Shortness of breath: Secondary | ICD-10-CM

## 2011-12-27 DIAGNOSIS — I2510 Atherosclerotic heart disease of native coronary artery without angina pectoris: Secondary | ICD-10-CM

## 2012-02-08 ENCOUNTER — Inpatient Hospital Stay: Payer: Self-pay | Admitting: Internal Medicine

## 2012-02-08 LAB — TROPONIN I: Troponin-I: <0.02 ng/mL

## 2012-02-08 LAB — CBC
HCT: 33.4 % — ABNORMAL LOW (ref 35.0–47.0)
HGB: 11.1 g/dL — ABNORMAL LOW (ref 12.0–16.0)
MCH: 28.5 pg (ref 26.0–34.0)
MCHC: 33.3 g/dL (ref 32.0–36.0)
RBC: 3.9 10*6/uL (ref 3.80–5.20)

## 2012-02-08 LAB — COMPREHENSIVE METABOLIC PANEL
Alkaline Phosphatase: 74 U/L (ref 50–136)
BUN: 89 mg/dL — ABNORMAL HIGH (ref 7–18)
Bilirubin,Total: 1.4 mg/dL — ABNORMAL HIGH (ref 0.2–1.0)
Creatinine: 3.67 mg/dL — ABNORMAL HIGH (ref 0.60–1.30)
EGFR (African American): 16 — ABNORMAL LOW
SGPT (ALT): 22 U/L
Total Protein: 7.4 g/dL (ref 6.4–8.2)

## 2012-02-08 LAB — URINALYSIS, COMPLETE
Leukocyte Esterase: NEGATIVE
Ph: 6 (ref 4.5–8.0)
Protein: 30
RBC,UR: 3 /HPF (ref 0–5)

## 2012-02-08 LAB — PROTIME-INR
INR: 5.4
Prothrombin Time: 48.8 secs — ABNORMAL HIGH (ref 11.5–14.7)

## 2012-02-08 LAB — CK TOTAL AND CKMB (NOT AT ARMC): CK-MB: 2.5 ng/mL (ref 0.5–3.6)

## 2012-02-09 LAB — CBC WITH DIFFERENTIAL/PLATELET
Eosinophil %: 0.8 %
HCT: 29.5 % — ABNORMAL LOW (ref 35.0–47.0)
HGB: 9.7 g/dL — ABNORMAL LOW (ref 12.0–16.0)
Lymphocyte #: 0.9 10*3/uL — ABNORMAL LOW (ref 1.0–3.6)
MCV: 86 fL (ref 80–100)
Monocyte %: 8.3 %
Neutrophil #: 7 10*3/uL — ABNORMAL HIGH (ref 1.4–6.5)
RBC: 3.43 10*6/uL — ABNORMAL LOW (ref 3.80–5.20)
WBC: 8.8 10*3/uL (ref 3.6–11.0)

## 2012-02-09 LAB — BASIC METABOLIC PANEL
Anion Gap: 12 (ref 7–16)
BUN: 82 mg/dL — ABNORMAL HIGH (ref 7–18)
BUN: 84 mg/dL — ABNORMAL HIGH (ref 7–18)
Calcium, Total: 8.6 mg/dL (ref 8.5–10.1)
Calcium, Total: 8.4 mg/dL — ABNORMAL LOW (ref 8.5–10.1)
Chloride: 108 mmol/L — ABNORMAL HIGH (ref 98–107)
Chloride: 106 mmol/L (ref 98–107)
Co2: 24 mmol/L (ref 21–32)
Creatinine: 2.39 mg/dL — ABNORMAL HIGH (ref 0.60–1.30)
Creatinine: 2.25 mg/dL — ABNORMAL HIGH (ref 0.60–1.30)
EGFR (African American): 26 — ABNORMAL LOW
EGFR (Non-African Amer.): 21 — ABNORMAL LOW
Glucose: 123 mg/dL — ABNORMAL HIGH (ref 65–99)
Glucose: 141 mg/dL — ABNORMAL HIGH (ref 65–99)
Osmolality: 311 (ref 275–301)
Osmolality: 311 (ref 275–301)

## 2012-02-09 LAB — CK TOTAL AND CKMB (NOT AT ARMC): CK, Total: 179 U/L (ref 21–215)

## 2012-02-10 LAB — BASIC METABOLIC PANEL
Calcium, Total: 8.2 mg/dL — ABNORMAL LOW (ref 8.5–10.1)
Chloride: 106 mmol/L (ref 98–107)
Co2: 26 mmol/L (ref 21–32)
Creatinine: 1.65 mg/dL — ABNORMAL HIGH (ref 0.60–1.30)
EGFR (African American): 39 — ABNORMAL LOW
Potassium: 3.7 mmol/L (ref 3.5–5.1)

## 2012-02-10 LAB — CBC WITH DIFFERENTIAL/PLATELET
Basophil #: 0 10*3/uL (ref 0.0–0.1)
Basophil %: 0.2 %
Eosinophil #: 0.1 10*3/uL (ref 0.0–0.7)
HCT: 29.4 % — ABNORMAL LOW (ref 35.0–47.0)
Lymphocyte #: 1.2 10*3/uL (ref 1.0–3.6)
MCV: 86 fL (ref 80–100)
Monocyte #: 0.9 10*3/uL — ABNORMAL HIGH (ref 0.0–0.7)
Monocyte %: 8.5 %
Neutrophil #: 8.1 10*3/uL — ABNORMAL HIGH (ref 1.4–6.5)
RBC: 3.41 10*6/uL — ABNORMAL LOW (ref 3.80–5.20)
RDW: 17.5 % — ABNORMAL HIGH (ref 11.5–14.5)
WBC: 10.3 10*3/uL (ref 3.6–11.0)

## 2012-02-10 LAB — URINE CULTURE

## 2012-02-10 LAB — PROTIME-INR
INR: 3
Prothrombin Time: 31.3 secs — ABNORMAL HIGH (ref 11.5–14.7)

## 2012-02-11 LAB — BASIC METABOLIC PANEL
Anion Gap: 9 (ref 7–16)
Chloride: 107 mmol/L (ref 98–107)
Co2: 26 mmol/L (ref 21–32)
EGFR (African American): >60
EGFR (Non-African Amer.): 56 — ABNORMAL LOW
Osmolality: 294 (ref 275–301)
Potassium: 3.7 mmol/L (ref 3.5–5.1)

## 2012-02-11 LAB — CBC WITH DIFFERENTIAL/PLATELET
Basophil #: 0 10*3/uL (ref 0.0–0.1)
Eosinophil #: 0.2 10*3/uL (ref 0.0–0.7)
Eosinophil %: 2.1 %
HCT: 28.8 % — ABNORMAL LOW (ref 35.0–47.0)
HGB: 9.4 g/dL — ABNORMAL LOW (ref 12.0–16.0)
Lymphocyte #: 1.2 10*3/uL (ref 1.0–3.6)
Lymphocyte %: 11.4 %
MCH: 28.3 pg (ref 26.0–34.0)
MCHC: 32.7 g/dL (ref 32.0–36.0)
Monocyte %: 8 %
Neutrophil #: 8.5 10*3/uL — ABNORMAL HIGH (ref 1.4–6.5)
Neutrophil %: 78.4 %
RBC: 3.32 10*6/uL — ABNORMAL LOW (ref 3.80–5.20)
RDW: 17.4 % — ABNORMAL HIGH (ref 11.5–14.5)
WBC: 10.8 10*3/uL (ref 3.6–11.0)

## 2012-02-11 LAB — PROTIME-INR: INR: 1.6

## 2012-02-12 LAB — BASIC METABOLIC PANEL
Calcium, Total: 8.4 mg/dL — ABNORMAL LOW (ref 8.5–10.1)
Chloride: 103 mmol/L (ref 98–107)
Co2: 24 mmol/L (ref 21–32)
Glucose: 101 mg/dL — ABNORMAL HIGH (ref 65–99)
Osmolality: 287 (ref 275–301)
Potassium: 4 mmol/L (ref 3.5–5.1)

## 2012-02-12 LAB — URINALYSIS, COMPLETE
Granular Cast: 2
Hyaline Cast: 2
Ketone: NEGATIVE
Protein: 100
Specific Gravity: 1.015 (ref 1.003–1.030)
WBC UR: 79 /HPF (ref 0–5)

## 2012-02-12 LAB — CBC WITH DIFFERENTIAL/PLATELET
Basophil %: 0.1 %
Eosinophil #: 0.3 10*3/uL (ref 0.0–0.7)
Eosinophil %: 2 %
HCT: 28.9 % — ABNORMAL LOW (ref 35.0–47.0)
HGB: 9.5 g/dL — ABNORMAL LOW (ref 12.0–16.0)
MCH: 28.5 pg (ref 26.0–34.0)
MCV: 87 fL (ref 80–100)
Monocyte #: 1.1 10*3/uL — ABNORMAL HIGH (ref 0.0–0.7)
Monocyte %: 8.4 %
Neutrophil #: 10 10*3/uL — ABNORMAL HIGH (ref 1.4–6.5)
Neutrophil %: 78.9 %
RBC: 3.33 10*6/uL — ABNORMAL LOW (ref 3.80–5.20)

## 2012-02-13 LAB — CBC WITH DIFFERENTIAL/PLATELET
Basophil %: 0 %
Eosinophil #: 0.3 10*3/uL (ref 0.0–0.7)
HCT: 28.2 % — ABNORMAL LOW (ref 35.0–47.0)
HGB: 9.3 g/dL — ABNORMAL LOW (ref 12.0–16.0)
Lymphocyte #: 1.4 10*3/uL (ref 1.0–3.6)
MCH: 28.6 pg (ref 26.0–34.0)
MCHC: 32.9 g/dL (ref 32.0–36.0)
MCV: 87 fL (ref 80–100)
Monocyte #: 0.9 10*3/uL — ABNORMAL HIGH (ref 0.0–0.7)
Neutrophil #: 8.5 10*3/uL — ABNORMAL HIGH (ref 1.4–6.5)
Neutrophil %: 76.8 %
RBC: 3.24 10*6/uL — ABNORMAL LOW (ref 3.80–5.20)

## 2012-02-13 LAB — BASIC METABOLIC PANEL
BUN: 39 mg/dL — ABNORMAL HIGH (ref 7–18)
Chloride: 102 mmol/L (ref 98–107)
Co2: 24 mmol/L (ref 21–32)
EGFR (Non-African Amer.): 51 — ABNORMAL LOW
Glucose: 103 mg/dL — ABNORMAL HIGH (ref 65–99)
Osmolality: 283 (ref 275–301)
Potassium: 4.1 mmol/L (ref 3.5–5.1)
Sodium: 137 mmol/L (ref 136–145)

## 2012-03-16 ENCOUNTER — Encounter: Payer: Self-pay | Admitting: Nurse Practitioner

## 2012-03-16 ENCOUNTER — Encounter: Payer: Self-pay | Admitting: Cardiothoracic Surgery

## 2012-03-29 ENCOUNTER — Encounter: Payer: Self-pay | Admitting: Cardiothoracic Surgery

## 2012-03-29 ENCOUNTER — Encounter: Payer: Self-pay | Admitting: Nurse Practitioner

## 2012-04-05 ENCOUNTER — Other Ambulatory Visit: Payer: Self-pay | Admitting: Surgery

## 2012-04-05 LAB — PROTIME-INR: INR: 2.1

## 2012-04-06 ENCOUNTER — Ambulatory Visit: Payer: Self-pay | Admitting: Surgery

## 2012-04-06 LAB — CBC WITH DIFFERENTIAL/PLATELET
Basophil #: 0 10*3/uL (ref 0.0–0.1)
Eosinophil %: 2.1 %
HCT: 38.1 % (ref 35.0–47.0)
Lymphocyte #: 1.8 10*3/uL (ref 1.0–3.6)
Lymphocyte %: 21.7 %
MCHC: 32.7 g/dL (ref 32.0–36.0)
MCV: 85 fL (ref 80–100)
Monocyte #: 0.7 10*3/uL (ref 0.0–0.7)
Monocyte %: 8 %
Platelet: 237 10*3/uL (ref 150–440)
RBC: 4.51 10*6/uL (ref 3.80–5.20)
RDW: 17.7 % — ABNORMAL HIGH (ref 11.5–14.5)
WBC: 8.4 10*3/uL (ref 3.6–11.0)

## 2012-04-06 LAB — COMPREHENSIVE METABOLIC PANEL
Albumin: 3.2 g/dL — ABNORMAL LOW (ref 3.4–5.0)
Anion Gap: 8 (ref 7–16)
BUN: 31 mg/dL — ABNORMAL HIGH (ref 7–18)
Bilirubin,Total: 0.8 mg/dL (ref 0.2–1.0)
Chloride: 99 mmol/L (ref 98–107)
EGFR (Non-African Amer.): 51 — ABNORMAL LOW
Osmolality: 286 (ref 275–301)
Potassium: 2.9 mmol/L — ABNORMAL LOW (ref 3.5–5.1)
SGPT (ALT): 11 U/L — ABNORMAL LOW
Total Protein: 7.1 g/dL (ref 6.4–8.2)

## 2012-04-14 ENCOUNTER — Ambulatory Visit: Payer: Self-pay | Admitting: Surgery

## 2012-04-14 LAB — PROTIME-INR
INR: 1.1
Prothrombin Time: 14.9 secs — ABNORMAL HIGH (ref 11.5–14.7)

## 2012-04-28 ENCOUNTER — Encounter: Payer: Self-pay | Admitting: Nurse Practitioner

## 2012-04-28 ENCOUNTER — Encounter: Payer: Self-pay | Admitting: Cardiothoracic Surgery

## 2012-05-29 ENCOUNTER — Encounter: Payer: Self-pay | Admitting: Nurse Practitioner

## 2012-05-29 ENCOUNTER — Encounter: Payer: Self-pay | Admitting: Cardiothoracic Surgery

## 2012-06-28 ENCOUNTER — Encounter: Payer: Self-pay | Admitting: Cardiothoracic Surgery

## 2012-06-28 ENCOUNTER — Encounter: Payer: Self-pay | Admitting: Nurse Practitioner

## 2012-07-29 ENCOUNTER — Encounter: Payer: Self-pay | Admitting: Cardiothoracic Surgery

## 2012-07-29 ENCOUNTER — Encounter: Payer: Self-pay | Admitting: Nurse Practitioner

## 2012-08-29 ENCOUNTER — Encounter: Payer: Self-pay | Admitting: Cardiothoracic Surgery

## 2012-08-29 ENCOUNTER — Encounter: Payer: Self-pay | Admitting: Nurse Practitioner

## 2012-12-02 ENCOUNTER — Ambulatory Visit: Payer: Self-pay | Admitting: Internal Medicine

## 2012-12-07 ENCOUNTER — Ambulatory Visit: Payer: Self-pay | Admitting: Internal Medicine

## 2013-06-09 ENCOUNTER — Ambulatory Visit: Payer: Self-pay | Admitting: Surgery

## 2013-06-23 ENCOUNTER — Ambulatory Visit: Payer: Self-pay | Admitting: Surgery

## 2013-07-07 ENCOUNTER — Ambulatory Visit: Payer: Self-pay | Admitting: Surgery

## 2013-07-07 LAB — PROTIME-INR: INR: 1.1

## 2013-10-15 ENCOUNTER — Inpatient Hospital Stay: Payer: Self-pay | Admitting: Internal Medicine

## 2013-10-15 LAB — COMPREHENSIVE METABOLIC PANEL
Anion Gap: 4 — ABNORMAL LOW (ref 7–16)
BUN: 30 mg/dL — ABNORMAL HIGH (ref 7–18)
Chloride: 109 mmol/L — ABNORMAL HIGH (ref 98–107)
Co2: 28 mmol/L (ref 21–32)
Creatinine: 0.98 mg/dL (ref 0.60–1.30)
EGFR (African American): >60
EGFR (Non-African Amer.): 57 — ABNORMAL LOW
Potassium: 4.6 mmol/L (ref 3.5–5.1)
SGOT(AST): 36 U/L (ref 15–37)

## 2013-10-15 LAB — URINALYSIS, COMPLETE
Bilirubin,UR: NEGATIVE
Blood: NEGATIVE
Hyaline Cast: 5
Ketone: NEGATIVE
Nitrite: NEGATIVE
Protein: NEGATIVE
RBC,UR: 11 /HPF (ref 0–5)
Specific Gravity: 1.008 (ref 1.003–1.030)
WBC UR: 237 /HPF (ref 0–5)

## 2013-10-15 LAB — CBC
HGB: 12 g/dL (ref 12.0–16.0)
MCV: 85 fL (ref 80–100)
Platelet: 180 10*3/uL (ref 150–440)
RDW: 15.9 % — ABNORMAL HIGH (ref 11.5–14.5)

## 2013-10-16 DIAGNOSIS — I4891 Unspecified atrial fibrillation: Secondary | ICD-10-CM

## 2013-10-16 DIAGNOSIS — K922 Gastrointestinal hemorrhage, unspecified: Secondary | ICD-10-CM

## 2013-10-16 DIAGNOSIS — I369 Nonrheumatic tricuspid valve disorder, unspecified: Secondary | ICD-10-CM

## 2013-10-16 DIAGNOSIS — R0602 Shortness of breath: Secondary | ICD-10-CM

## 2013-10-16 DIAGNOSIS — E669 Obesity, unspecified: Secondary | ICD-10-CM

## 2013-10-16 DIAGNOSIS — Z954 Presence of other heart-valve replacement: Secondary | ICD-10-CM

## 2013-10-16 LAB — CBC WITH DIFFERENTIAL/PLATELET
Basophil #: 0 10*3/uL (ref 0.0–0.1)
Basophil %: 0.2 %
Eosinophil %: 1.9 %
HCT: 34.8 % — ABNORMAL LOW (ref 35.0–47.0)
Lymphocyte #: 1.8 10*3/uL (ref 1.0–3.6)
Lymphocyte %: 22.3 %
MCHC: 33.3 g/dL (ref 32.0–36.0)
MCV: 86 fL (ref 80–100)
Monocyte %: 10.5 %
Neutrophil #: 5.3 10*3/uL (ref 1.4–6.5)
Neutrophil %: 65.1 %
RBC: 4.06 10*6/uL (ref 3.80–5.20)
RDW: 15.9 % — ABNORMAL HIGH (ref 11.5–14.5)
WBC: 8.2 10*3/uL (ref 3.6–11.0)

## 2013-10-16 LAB — BASIC METABOLIC PANEL
Anion Gap: 4 — ABNORMAL LOW (ref 7–16)
Calcium, Total: 8.7 mg/dL (ref 8.5–10.1)
Co2: 27 mmol/L (ref 21–32)
EGFR (African American): 54 — ABNORMAL LOW
EGFR (Non-African Amer.): 46 — ABNORMAL LOW
Potassium: 4.5 mmol/L (ref 3.5–5.1)

## 2013-10-17 LAB — CBC WITH DIFFERENTIAL/PLATELET
Eosinophil %: 2.5 %
HCT: 35.4 % (ref 35.0–47.0)
HGB: 11.6 g/dL — ABNORMAL LOW (ref 12.0–16.0)
Lymphocyte #: 1.3 10*3/uL (ref 1.0–3.6)
Lymphocyte %: 16.7 %
MCV: 86 fL (ref 80–100)
Monocyte %: 8.7 %
Neutrophil #: 5.5 10*3/uL (ref 1.4–6.5)

## 2013-10-17 LAB — BASIC METABOLIC PANEL
BUN: 28 mg/dL — ABNORMAL HIGH (ref 7–18)
Chloride: 106 mmol/L (ref 98–107)
Co2: 26 mmol/L (ref 21–32)
Creatinine: 1.29 mg/dL (ref 0.60–1.30)
Potassium: 5.1 mmol/L (ref 3.5–5.1)
Sodium: 136 mmol/L (ref 136–145)

## 2013-10-17 LAB — PROTIME-INR
INR: 2.4
Prothrombin Time: 25.8 secs — ABNORMAL HIGH (ref 11.5–14.7)

## 2013-10-17 LAB — URINE CULTURE

## 2013-12-06 ENCOUNTER — Ambulatory Visit: Payer: Self-pay | Admitting: Surgery

## 2014-02-28 ENCOUNTER — Encounter: Payer: Self-pay | Admitting: Podiatry

## 2014-02-28 ENCOUNTER — Ambulatory Visit (INDEPENDENT_AMBULATORY_CARE_PROVIDER_SITE_OTHER): Payer: Medicare Other | Admitting: Podiatry

## 2014-02-28 ENCOUNTER — Ambulatory Visit (INDEPENDENT_AMBULATORY_CARE_PROVIDER_SITE_OTHER): Payer: Medicare Other

## 2014-02-28 VITALS — BP 94/49 | HR 61 | Resp 16 | Ht 68.0 in | Wt 290.0 lb

## 2014-02-28 DIAGNOSIS — Q828 Other specified congenital malformations of skin: Secondary | ICD-10-CM

## 2014-02-28 DIAGNOSIS — M109 Gout, unspecified: Secondary | ICD-10-CM

## 2014-02-28 DIAGNOSIS — M2010 Hallux valgus (acquired), unspecified foot: Secondary | ICD-10-CM

## 2014-02-28 DIAGNOSIS — M79673 Pain in unspecified foot: Secondary | ICD-10-CM

## 2014-02-28 DIAGNOSIS — L84 Corns and callosities: Secondary | ICD-10-CM

## 2014-02-28 DIAGNOSIS — M79609 Pain in unspecified limb: Secondary | ICD-10-CM

## 2014-02-28 DIAGNOSIS — M779 Enthesopathy, unspecified: Secondary | ICD-10-CM

## 2014-02-28 MED ORDER — DICLOFENAC SODIUM 75 MG PO TBEC: 75.0000 mg | DELAYED_RELEASE_TABLET | Freq: Two times a day (BID) | ORAL | Status: AC

## 2014-02-28 MED ORDER — TRIAMCINOLONE ACETONIDE 10 MG/ML IJ SUSP: 10.0000 mg | Freq: Once | INTRAMUSCULAR | Status: AC

## 2014-02-28 NOTE — Progress Notes (Signed)
Subjective:     Patient ID: [name] [name], female   DOB: 05/04/1941, 73 y.o.   MRN: [id number]  Foot Pain   patient states that she is having a lot of pain in her right foot and a callus underneath her right foot. She stated the pain started in her big toe joint and has moved to the rest of the foot with the toe joint remaining the biggest problem for her.   Review of Systems  All other systems reviewed and are negative.       Objective:   Physical Exam  Nursing note and vitals reviewed. Constitutional: She is oriented to person, place, and time.  Cardiovascular: Intact distal pulses.   Musculoskeletal: Normal range of motion.  Neurological: She is oriented to person, place, and time.  Skin: Skin is dry.   neurovascular status is intact both feet with diminished range of motion and mild equinus noted. There is redness and discomfort around the first MPJ right medial side and mild discomfort across the entire forefoot she appears to be more related to change in gait. Severe keratotic lesion underneath the right heel that is painful    Assessment:     Possible gout or inflammatory capsulitis first MPJ right with change in gait and lesion sub-right heel is painful when pressed consistent with callus porokeratosis type lesion    Plan:     H&P and x-ray reviewed. Injected the first MPJ right 3 mg Kenalog 5 mg Xylocaine Marcaine mixture and debrided the plantar callus right and applied Band-Aid as was a small amount of bleeding with Neosporin and instructed on soaks. Sent for blood work to rule out gout or inflammatory arthritis and reappoint in 2 weeks

## 2014-02-28 NOTE — Progress Notes (Signed)
   Subjective:    Patient ID: [name] [name], female    DOB: 05/05/1941, 73 y.o.   MRN: [id number]  HPI Comments: N pain L rt foot dorsal, medial all over -callus? D 4 days O sudden C worse A weight T pt takes acetaminophen,    Foot Pain      Review of Systems  Constitutional: Negative.   HENT:       Sinus problems Ringing in ears  Eyes: Positive for itching.  Respiratory:       Difficulty breathing Shortness of breathing  Cardiovascular:       Calf pain when walking  Gastrointestinal: Negative.   Endocrine: Positive for cold intolerance.       Excessive thirst  Genitourinary: Negative.   Musculoskeletal:       Joint pain Back pain Difficulty walking Muscle pain  Skin: Negative.   Allergic/Immunologic: Negative.   Neurological: Negative.   Hematological: Negative.   Psychiatric/Behavioral: Negative.        Objective:   Physical Exam        Assessment & Plan:

## 2014-03-01 ENCOUNTER — Encounter: Payer: Self-pay | Admitting: Podiatry

## 2014-03-03 ENCOUNTER — Ambulatory Visit: Payer: Self-pay | Admitting: Podiatry

## 2014-03-14 ENCOUNTER — Encounter: Payer: Self-pay | Admitting: Podiatry

## 2014-03-14 ENCOUNTER — Ambulatory Visit (INDEPENDENT_AMBULATORY_CARE_PROVIDER_SITE_OTHER): Payer: Medicare Other | Admitting: Podiatry

## 2014-03-14 VITALS — BP 110/62 | HR 78 | Resp 16 | Ht 67.0 in | Wt 285.0 lb

## 2014-03-14 DIAGNOSIS — M779 Enthesopathy, unspecified: Secondary | ICD-10-CM

## 2014-03-14 DIAGNOSIS — M109 Gout, unspecified: Secondary | ICD-10-CM

## 2014-03-14 NOTE — Progress Notes (Signed)
PT IS TO GO OUT FOR ARTHRITIC PROFILE IN ONE MONTH. I MADE PT NOTE ON BLOOD WORK FORM TO GO TO [facility] ON Friday 4.17.15. PT UNDERSTOOD.

## 2014-03-16 NOTE — Progress Notes (Signed)
Subjective:     Patient ID: [name] [name], female   DOB: 09/09/1941, 73 y.o.   MRN: [id number]  HPI patient presents stating my right foot is feeling some better and I wanted to review my lab work that was done   Review of Systems     Objective:   Physical Exam Neurovascular status unchanged with patient well oriented x3 in significant reduction and discomfort around the first MPJ right with mild edema and pain when pressed deeply    Assessment:     Improvement of inflammatory condition first MPJ right    Plan:     Reviewed condition of elevated uric acid C. reactive protein and gave patient a copy of blood work to give to her physician. At this time she is not having symptoms and does not give long-term history of gout attacks so we are going to watch this currently and then consider medication. I did give her instructions on foods to watch and we'll reevaluate her again in approximately 2 months

## 2014-04-28 DEATH — deceased

## 2015-04-20 NOTE — Discharge Summary (Signed)
PATIENT NAME:  [name] [name], [name] [name] MR#:  [id number] DATE OF BIRTH:  05-13-1941  DATE OF ADMISSION:  10/15/2013 DATE OF DISCHARGE: 10/17/2013   DISCHARGE DIAGNOSES:  1.  Lower gastrointestinal bleed secondary to internal hemorrhoids. 2.  Urinary tract infection.  3.  Chronic atrial fibrillation with anticoagulation.  4.  Congestive heart failure, chronic systolic.  5.  History of cerebrovascular accident.  6.  Chronic back pain.   DISCHARGE MEDICATIONS: Ranitidine 150 mg b.i.d., metoprolol tartrate 50 mg b.i.d., gabapentin 300 mg b.i.d., simvastatin 40 mg at bedtime, hydroxyzine 25 mg t.i.d. p.r.n. itching, Zoloft 50 mg daily, magnesium oxide 500 mg daily, 2 liters O2 nasal cannula at bedtime, torsemide 20 mg b.i.d., losartan 100 mg half tab daily, metolazone 5 mg once weekly on Monday, warfarin 2.5 mg at bedtime, hydrocortisone 25 mg suppository q.i.d. x 1 week and Augmentin 500 mg b.i.d. for 1 week.   REASON FOR ADMISSION: A 74 year old female, who presents with lower GI bleed. Please see H and P for history of present illness, past medical history and physical exam.   HOSPITAL COURSE: The patient was admitted, found to have bleeding internal hemorrhoids. Her hemoglobin dropped to 11.6 and then was stable. No more bleeding noted. Her Coumadin was held. Initial INR was 2.7. Dr. Mariah MillingGollan from cardiology recommended running her INR between the 1.8 to 2 range to limit further bleeding, but continue her on Coumadin, low dose. She will resume aspirin in 1 week. Her potassium was slightly elevated. She will hold her potassium for 3 days before resuming 3 potassium pills daily. She will follow up with Dr. Hyacinth MeekerMiller on Friday.  ____________________________ Danella PentonMark F. Cutter Passey, MD mfm:aw D: 10/17/2013 07:49:25 ET T: 10/17/2013 08:14:25 ET JOB#: [id number]  cc: Danella PentonMark F. Herschell Virani, MD, <Dictator> Charita Lindenberger Sherlene ShamsF Tyrhonda Georgiades MD ELECTRONICALLY SIGNED 10/17/2013 8:24

## 2015-04-20 NOTE — Consult Note (Signed)
Chief Complaint:  Subjective/Chief Complaint seen for hematochezia.  no bleeding since yesterday.  no abdominal pain or nausea.   VITAL SIGNS/ANCILLARY NOTES: **Vital Signs.:   19-Oct-14 08:00  Vital Signs Type Routine  Temperature Temperature (F) 98.2  Temperature Source axillary  Pulse Pulse 77  Respirations Respirations 18  Systolic BP Systolic BP 702  Diastolic BP (mmHg) Diastolic BP (mmHg) 74  Mean BP 90  Pulse Ox % Pulse Ox % 92  Pulse Ox Activity Level  At rest  Oxygen Delivery 2L; Nasal Cannula   Brief Assessment:  Cardiac Irregular   Respiratory clear BS   Gastrointestinal details normal Soft  Nontender  Nondistended  Bowel sounds normal  obese   Lab Results:  Lab:  19-Oct-14 04:00   O2 Saturation (Pulse Ox) 93 (Result(s) reported on 16 Oct 2013 at 05:58AM.)  Routine Chem:  19-Oct-14 06:03   Glucose, Serum  102  BUN  24  Creatinine (comp) 1.16  Sodium, Serum 140  Potassium, Serum 4.5  Chloride, Serum  109  CO2, Serum 27  Calcium (Total), Serum 8.7  Anion Gap  4  Osmolality (calc) 284  eGFR (African American)  54  eGFR (Non-African American)  46 (eGFR values <32m/min/1.73 m2 may be an indication of chronic kidney disease (CKD). Calculated eGFR is useful in patients with stable renal function. The eGFR calculation will not be reliable in acutely ill patients when serum creatinine is changing rapidly. It is not useful in  patients on dialysis. The eGFR calculation may not be applicable to patients at the low and high extremes of body sizes, pregnant women, and vegetarians.)  Routine Coag:  19-Oct-14 06:03   Prothrombin  28.4  INR 2.8 (INR reference interval applies to patients on anticoagulant therapy. A single INR therapeutic range for coumarins is not optimal for all indications; however, the suggested range for most indications is 2.0 - 3.0. Exceptions to the INR Reference Range may include: Prosthetic heart valves, acute myocardial infarction,  prevention of myocardial infarction, and combinations of aspirin and anticoagulant. The need for a higher or lower target INR must be assessed individually. Reference: The Pharmacology and Management of the Vitamin K  antagonists: the seventh ACCP Conference on Antithrombotic and Thrombolytic Therapy. COVZCH.8850Sept:126 (3suppl): [id number] A HCT value >55% may artifactually increase the PT.  In one study,  the increase was an average of 25%. Reference:  "Effect on Routine and Special Coagulation Testing Values of Citrate Anticoagulant Adjustment in Patients with High HCT Values." American Journal of Clinical Pathology 2006;126:400-405.)  Routine Hem:  18-Oct-14 08:32   Hemoglobin (CBC) 12.0    16:02   Hemoglobin (CBC)  11.6 (Result(s) reported on 15 Oct 2013 at 04:36PM.)  19-Oct-14 06:03   WBC (CBC) 8.2  RBC (CBC) 4.06  Hemoglobin (CBC)  11.6  Hematocrit (CBC)  34.8  Platelet Count (CBC) 187  MCV 86  MCH 28.4  MCHC 33.3  RDW  15.9  Neutrophil % 65.1  Lymphocyte % 22.3  Monocyte % 10.5  Eosinophil % 1.9  Basophil % 0.2  Neutrophil # 5.3  Lymphocyte # 1.8  Monocyte # 0.9  Eosinophil # 0.2  Basophil # 0.0 (Result(s) reported on 16 Oct 2013 at 0[facility])   Radiology Results: XRay:    19-Oct-14 09:46, Chest PA and Lateral  Chest PA and Lateral   REASON FOR EXAM:    CHF  COMMENTS:       PROCEDURE: DXR - DXR CHEST PA (OR AP) AND LATERAL  - Oct 16 2013  9:46AM     RESULT: Comparison is made to the exam of 10/15/2013.    The cardiac silhouette is enlarged. Sternotomy wires are present. There   is mild edema. There is no effusion or pneumothorax. Prosthetic cardiac   valve is present. Atherosclerotic calcification is seen. Monitoring   electrodes are present.    IMPRESSION:   1. Cardiomegaly with mild interstitial edema.  Dictation Site: 6        Verified By: Sundra Aland, M.D., MD   Assessment/Plan:  Assessment/Plan:  Assessment 1) hematochezia-stable,  not recurrent.  hemorrhoidal/anal outlet. agree with 7 day course of hydrocortisone supp.  as you are.  recommend outpatient referral to Dr Rochel Brome for evaluation for possible hemorrhoidal banding.  Following.   Electronic Signatures: Loistine Simas (MD)  (Signed 19-Oct-14 13:24)  Authored: Chief Complaint, VITAL SIGNS/ANCILLARY NOTES, Brief Assessment, Lab Results, Radiology Results, Assessment/Plan   Last Updated: 19-Oct-14 13:24 by Loistine Simas (MD)

## 2015-04-20 NOTE — Consult Note (Signed)
PATIENT NAME:  [name] [name], [name] [name] MR#:  [id number] DATE OF BIRTH:  21-Dec-1941  DATE OF CONSULTATION:  10/15/2013  CONSULTING PHYSICIAN:  Christena DeemMartin U. Briunna Leicht, MD  PRIMARY CARE PHYSICIAN: Danella PentonMark F. Miller, MD  REASON FOR CONSULTATION: Hematochezia.   HISTORY OF PRESENT ILLNESS: [name] [name] is a very pleasant 74 year old Caucasian female who was admitted to the hospital earlier today with complaint of some rectal bleeding. She states that over the period of the past couple of months, she has been seeing some occasional blood after a bowel movement on the toilet paper, this being bright red in nature. Yesterday evening, she passed a stool and there was a lot of blood associated with this. She had several episodes overnight and she decided to come to the Emergency Room today. She has had no nausea, vomiting or abdominal pain. She has no heartburn. She did undergo a tracheostomy during a hospitalization for heart surgery in 2009 and will occasionally get some dysphagia associated with that, this being more so a discomfort than actual restriction. She states that she has a bowel movement perhaps 3 to 5 times a day that are formed. She did have a colonoscopy on 04/08/2006, that being done for colon screening. She was found to have multiple internal nonbleeding hemorrhoids. Otherwise, the colonoscopy was normal and a repeat in 10 years was recommended.   GASTROINTESTINAL FAMILY HISTORY: Negative for colorectal cancer, liver disease or ulcers.   PAST MEDICAL HISTORY:  1.  The patient has a history of atherosclerotic cardiovascular disease and is post CABG. The patient had a complicated hospital stay during her CABG, requiring prolonged mechanical ventilation and placement of tracheostomy. She has a history of aortic stenosis and is post aortic valve replacement. She has a history of chronic atrial fibrillation, on anticoagulation with Coumadin. Her Coumadin was supratherapeutic on admission to the hospital.  2.  She  has a history of osteoarthritis.  3.  She does have a history of hypertension/benign hypertension.  4.  Reflux esophagitis.  5.  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  6.  Chronic back pain.  7.  Status post lumbar kyphoplasty.  8.  Post breast biopsy, this being done a couple of months ago and is due to have a recheck soon.   OUTPATIENT MEDICATIONS: Include aspirin 81 mg daily, gabapentin 300 mg twice a day, hydroxyzine 25 mg 3 times a day p.r.n., losartan 100 mg 1/2 tablet daily, magnesium oxide 500 mg once a day in the morning, metolazone 5 mg once a week on Monday, metoprolol tartrate 50 mg twice a day, potassium chloride 20 mEq 4 tablets once a day in the morning and 3 tablets orally in the evening, ranitidine 150 mg twice a day, sertraline 50 mg once a day, simvastatin 40 mg daily, torsemide 20 mg twice a day, warfarin 5 mg once a day at bedtime.   ALLERGIES: ACE INHIBITORS AND SULFA DRUGS.  REVIEW OF SYSTEMS: Ten systems reviewed. Occasional rectal bleeding associated with a bowel movement, this of small volume and bright red, generally seen on the toilet paper, not enough to change color of toilet bowl. Negative for other systems.   PHYSICAL EXAMINATION: VITAL SIGNS: Temperature is 97.3, pulse 58, respirations 18, blood pressure 110/74. I did have orthostatics done on the patient, with lying flat pressure being 138/80, pulse 74, and sitting at the edge of the bed after 3 minutes 148/76 with a 76 heart rate.  GENERAL: She is a 74 year old Caucasian female, overweight/obese. No acute distress.  HEENT: Normocephalic, atraumatic. Eyes are anicteric. Nose: Septum midline. No lesions. Oropharynx: No lesions.  NECK: Supple. No JVD.  HEART: Irregular rate and rhythm.  LUNGS: Bilaterally clear.  ABDOMEN: Soft, nontender, nondistended. Bowel sounds positive, normoactive.  EXTREMITIES: 1+ lower extremity edema.  ANORECTAL: Shows stool that is soft with a very watery, thin bloody  effluent associated with this. This does not look maroon. This does not look like melena, but rather anal outlet type bleeding with associated stool. There are multiple hemorrhoids felt as well.   LABORATORY AND RADIOLOGICAL DATA: Include the following: On admission this morning, she had a glucose of 109, BUN 30, creatinine 0.98, sodium 141, potassium 4.6, chloride 109, bicarbonate 28, calcium 9.3, lipase 217. Hepatic profile was normal with the exception of a low albumin at 2.9. The hemogram showed a white count of 7.3, hemoglobin and hematocrit 12.0/36.4, platelet count 180, MCV 85. Hemoglobin recheck at 8 hours was 11.6. Her pro time on admission was 35.4 with an INR of 3.7; activated PTT was 44.4. Urinalysis showed 3+ leukocyte esterase, 237 white blood cells per high-power field. There was a portable chest film done, this showing cardiomegaly with likely chronic cardiac decompensation and mild edema. No significant acute change from previous studies.   ASSESSMENT: Hematochezia: On rectal examination, this appears much more like anal outlet bleeding, probably associated with internal hemorrhoids, than a diverticular bleed or more proximal gastrointestinal bleeding. The patient is not orthostatic. It has been about 7 years since her last colonoscopy. There is no family history of colon or rectal cancer.   RECOMMENDATION: Continue serial hemoglobins. Allow the pro time to normalize. Transfuse as needed. Agree with PPI for prophylaxis. If there is any indication of increased blood loss or possibility of change in character of the bleeding, would do a tagged bleeding scan to help determine source/site of colon. Will follow with you.   Thank you for this consult.   ____________________________ Christena Deem, MD mus:jm D: 10/15/2013 20:17:00 ET T: 10/15/2013 20:55:12 ET JOB#: [id number]  cc: Christena Deem, MD, <Dictator> Christena Deem MD ELECTRONICALLY SIGNED 10/29/2013 15:40

## 2015-04-20 NOTE — Consult Note (Signed)
Brief Consult Note: Diagnosis: hematochezia.   Patient was seen by consultant.   Consult note dictated.   Recommend further assessment or treatment.   Comments: Please see full GI consult [phone]#[id number].  74 yo female admitted to hospital with revtal bleeding in the setting of supratheraputic INR (on coumadin for chronic AF).  Hemodynamically stable, not othrostatic.  Impression, anal outlet bleeding, possible internal hemorrhoids.  REcommend serial hgbn, transfuse as needed.  Allow pt to normalize.  If nature of bleeding changes in character, would do GI bleeding scan to assist localization.  Will follow with you.  Electronic Signatures: Barnetta ChapelSkulskie, Martin (MD)  (Signed 18-Oct-14 20:21)  Authored: Brief Consult Note   Last Updated: 18-Oct-14 20:21 by Barnetta ChapelSkulskie, Martin (MD)

## 2015-04-20 NOTE — Consult Note (Signed)
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 S/p back surgery following a fall on admission. She presents with lower Gi bleed. Cardiology was consulted for assistance with anticoagulation in setting of h/o atrial fib.  Presents with recent  bright-red blood per rectum. She had an episode several weeks ago and did not seek medical attention. She developed severe lower GI bleeding last night and this morning. She denies any ABD pain. She reports having SOB with any exertion. Take torsemide daily, metolazone once a week. She has had recent provblems with her INR, running high. She has been told over the past few months to hold it for days for elevated INR.   In the Emergency Room where she was hemodynamically stable with a hemoglobin of 12.  She is coagulopathic because of her Coumadin with a supratherapeutic INR of 3.7.   Present Illness . Past Surgical History:  1. 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      (23-mm valve, model 3000, serial number [id number]).    2. Coronary bypass grafting x2 (left internal mammary artery LAD, ..      saphenous vein graft to distal RCA).    3. Endoscopic vein harvest of the right leg greater saphenous vein. .  4. Placement of tracheostomy (#8 Shiley). ..   5. Placement of left pleural chest tube for drainage of pleural        effusion. ...   6. Flexible bronchoscopy for airway secretions. ..   ECHO 06/2009 EF 45%,  moderate LVH, significant TR, significantly dilated LA., RV with mildly reduced function   ALLERGIES: ACE inhibitors she gets a cough, sulfur she gets rash and swelling, although she is taking Lasix and torsemide.    FAMILY HISTORY: Father with lung disease post smoke inhalation. Mom died at age 52 of myocardial infarction and diabetes.   SOCIAL HISTORY: Nonsmoker, nondrinker. Married with two children.   Physical Exam:  GEN obese   HEENT red conjunctivae   NECK supple  No masses   RESP normal resp effort   CARD Irregular rate and rhythm  Normal, S1, S2   ABD denies tenderness  soft   LYMPH negative neck   EXTR negative cyanosis/clubbing, positive edema   SKIN LE with erythema, skin breakdown on the posterior aspect of the  legs   NEURO cranial nerves intact, motor/sensory function intact   PSYCH alert, A+O to time, place, person, good insight   Review of Systems:  Subjective/Chief Complaint Back pain, palpitations   General: Weakness   Skin: erythema of lower extremities   ENT: No Complaints   Eyes: No Complaints   Neck: No Complaints   Respiratory: Short of breath   Cardiovascular: Dyspnea   Gastrointestinal: No Complaints   Vascular: No Complaints   Musculoskeletal: Backpain   Neurologic: No Complaints   Endocrine: No Complaints   Psychiatric: No Complaints   Review of Systems: All other systems were reviewed and found  to be negative   Medications/Allergies Reviewed Medications/Allergies reviewed     cellulitis left leg:    arthritis:    CHF:    A Fib:    spine fx:    htn:    CABG:   Home Medications: Medication Instructions Status  ranitidine 150 mg oral tablet 1 tab(s) orally 2 times a day Active  metoprolol tartrate 50 mg oral tablet 1 tab(s) orally 2 times a day Active  gabapentin 300 mg oral capsule 1 cap(s) orally 2 times a day Active  simvastatin 40 mg oral tablet 1 tab(s) orally once a day (at bedtime) Active   hydrOXYzine hydrochloride 25 mg oral tablet 1 tab(s) orally 3 times a day, As Needed Active  sertraline 50 mg oral tablet 1 tab(s) orally once a day, As Needed Active  aspirin 81 mg oral tablet 1 tab(s) orally once a day (in the morning) Active  magnesium oxide 500 mg oral tablet 1 tab(s) orally once a day (in the morning) Active  oxygen oxygen @ 2 liter(s) via nasal cannula once a day (at bedtime). Active  potassium chloride 20 mEq oral tablet, extended release 4 tabs (41mq) orally once a day (in the morning) and 3 tabs (617m) oral once a day (in the evening). Active  torsemide 20 mg oral tablet 1 tab(s) orally 2 times a day Active  warfarin 5 mg oral tablet 1 tab(s) orally once a day (at bedtime) Active  losartan 100 mg oral tablet 0.5 tab (5075morally once a day (in the morning). Active  metolazone 5 mg oral tablet 1 tab(s) orally once a week on Monday. Active   Lab Results:  Hepatic:  18-Oct-14 08:32   Bilirubin, Total 0.7  Alkaline Phosphatase 106  SGPT (ALT) 14  SGOT (AST) 36  Total Protein, Serum 7.1  Albumin, Serum  2.9  Routine BB:  18-Oct-14 08:32   ABO Group + Rh Type O Positive  Antibody Screen NEGATIVE (Result(s) reported on 15 Oct 2013 at 09:22AM.)  Routine Chem:  18-Oct-14 08:32   Glucose, Serum  109  BUN  30  Creatinine (comp) 0.98  Sodium, Serum 141  Potassium, Serum 4.6  Chloride, Serum  109  CO2, Serum 28  Calcium (Total), Serum 9.3  Anion Gap  4  Osmolality (calc) 288  eGFR (African American) >60  eGFR (Non-African American)  57 (eGFR values <68m36mn/1.73 m2 may be an indication of chronic kidney disease (CKD). Calculated eGFR is useful in patients with stable renal function. The eGFR calculation will not be reliable in acutely ill patients when serum creatinine is changing rapidly. It is not useful in  patients on dialysis. The eGFR calculation may not be applicable to patients at the low and high extremes of body sizes, pregnant women, and  vegetarians.)  Result Comment Potassium/AST - Slight hemolysis, interpret results with  - caution.  Lipase 217 (Result(s) reported on 15 Oct 2013 at 08:58AM.)  Routine UA:  18-Oct-14 08:32   Color (UA) Yellow  Clarity (UA) Cloudy  Glucose (UA) Negative  Bilirubin (UA) Negative  Ketones (UA) Negative  Specific Gravity (UA) 1.008  Blood (UA) Negative  pH (UA) 5.0  Protein (UA) Negative  Nitrite (UA) Negative  Leukocyte Esterase (UA) 3+ (Result(s) reported on 15 Oct 2013 at 09:08AM.)  RBC (UA) 11 /HPF  WBC (UA) 237 /HPF  Bacteria (UA) NONE SEEN  Epithelial Cells (UA) 1 /HPF  WBC Clump (UA) PRESENT  Mucous (UA) PRESENT  Hyaline Cast (UA) 5 /LPF (Result(s) reported  on 15 Oct 2013 at 09:08AM.)  Routine Coag:  18-Oct-14 08:32   Prothrombin  35.4  INR 3.7 (INR reference interval applies to patients on anticoagulant therapy. A single INR therapeutic range for coumarins is not optimal for all indications; however, the suggested range for most indications is 2.0 - 3.0. Exceptions to the INR Reference Range may include: Prosthetic heart valves, acute myocardial infarction, prevention of myocardial infarction, and combinations of aspirin and anticoagulant. The need for a higher or lower target INR must be assessed individually. Reference: The Pharmacology and Management of the Vitamin K  antagonists: the seventh ACCP Conference on Antithrombotic and Thrombolytic Therapy. IOXBD.5329 Sept:126 (3suppl): N9146842. A HCT value >55% may artifactually increase the PT.  In one study,  the increase was an average of 25%. Reference:  "Effect on Routine and Special Coagulation Testing Values of Citrate Anticoagulant Adjustment in Patients with High HCT Values." American Journal of Clinical Pathology 2006;126:400-405.)  Activated PTT (APTT)  44.4 (A HCT value >55% may artifactually increase the APTT. In one study, the increase was an average of 19%. Reference: "Effect on Routine and Special  Coagulation Testing Values of Citrate Anticoagulant Adjustment in Patients with High HCT Values." American Journal of Clinical Pathology 2006;126:400-405.)  Routine Hem:  18-Oct-14 08:32   WBC (CBC) 7.3  RBC (CBC) 4.27  Hemoglobin (CBC) 12.0  Hematocrit (CBC) 36.4  Platelet Count (CBC) 180 (Result(s) reported on 15 Oct 2013 at 08:45AM.)  MCV 85  MCH 28.1  MCHC 33.0  RDW  15.9    16:02   Hemoglobin (CBC)  11.6 (Result(s) reported on 15 Oct 2013 at 04:36PM.)  19-Oct-14 06:03   Hemoglobin (CBC)  11.6   EKG:  Interpretation EKG showing  atrial fib, RBBB, rate of 67 bpm Rate of 180 at 7:05 pm and 8:15 pm   Radiology Results:  XRay:    19-Oct-14 09:46, Chest PA and Lateral  Chest PA and Lateral   REASON FOR EXAM:    CHF  COMMENTS:       PROCEDURE: DXR - DXR CHEST PA (OR AP) AND LATERAL  - Oct 16 2013  9:46AM     RESULT: Comparison is made to the exam of10/18/2014.    The cardiac silhouette is enlarged. Sternotomy wires are present. There   is mild edema. There is no effusion or pneumothorax. Prosthetic cardiac   valve is present. Atherosclerotic calcification is seen. Monitoring   electrodes are present.    IMPRESSION:   1. Cardiomegaly with mild interstitial edema.  Dictation Site: 6        Verified By: Sundra Aland, M.D., MD    Sulfa drugs: Rash  Ace Inhibitors: Cough  Vital Signs/Nurse's Notes: **Vital Signs.:   19-Oct-14 06:30  Vital Signs Type Routine  Temperature Temperature (F) 98.1  Celsius 36.7  Temperature Source oral  Pulse Pulse 68  Respirations Respirations 24  Systolic BP Systolic BP 924  Diastolic BP (mmHg) Diastolic BP (mmHg) 76  Mean BP 87  Pulse Ox % Pulse Ox % 92  Pulse Ox Activity Level  At rest  Oxygen Delivery 2L    Impression 74 year old female with past medical history of CAD, CABG x 2 in 2009 with pericardial aortic valve replacement, HTN,  paroxysmal atrial fibrillation on Coumadin,  history of diastolic congestive heart  failure with admission in 01/2010 to [facility],   severe back pain after fall in 2012, presenting with lower GI bleed.  1) Lower Gi bleed, suspect diverticuli (unable to exclude hemorrhoid)  previous short bleed one month ago She reports INR has been running high with frequent holds of her warfarin as an outpt (3 days holds, or longer). INR on admission 3.7 Rate is well controlled on current meds. --Would favor running INR low 1.8 to 2.2. She currently take warfarin 5 mg daily. In a few days, once there is clearly no blood with BMS, could consider taking warfarin 2.5 mg daily, close INR checks, run at lower level. --Given underlying CAD, would favor continuing asa 81 mg daily If she continues to have bleeding, may have to hold warfarin. Being on asa and plavix would place her in the same bleeding risk as warfarin with suboptimal anticoagulation benefit.   2) Atrial fibrillation Rate well controlled. May be contributing to acute on chronic diastolic CHF  3) AVR tissue valve placed in 2009 Echo ordered to evaluate valve, has been several years  4) SOB: Significant sx with exertion, take torsemide once a day edema in legs. Suspect RVSP will be high on echo (ordertd) Currently on lasix Q12  5) Obesity/deconditioning Needs assistance to bathroom   Electronic Signatures: Ida Rogue (MD)  (Signed 19-Oct-14 12:06)  Authored: General Aspect/Present Illness, History and Physical Exam, Review of System, Past Medical History, Home Medications, Labs, EKG , Radiology, Allergies, Vital Signs/Nurse's Notes, Impression/Plan   Last Updated: 19-Oct-14 12:06 by Ida Rogue (MD)

## 2015-04-20 NOTE — H&P (Signed)
PATIENT NAME:  [name] [name], [name] MR#:  [id number] DATE OF BIRTH:  1941-04-23  DATE OF ADMISSION:  10/15/2013  REFERRING PHYSICIAN:  Dr. Cyril LoosenKinner.   FAMILY PHYSICIAN:  Dr. Bethann PunchesMark Miller.   REASON FOR ADMISSION:  Lower GI bleed.   HISTORY OF PRESENT ILLNESS:  The patient is a 74 year old female with a history of coronary artery disease status post CABG, aortic stenosis status post aortic valve replacement, and chronic atrial fibrillation status post failed ablation/cardioversion on chronic anticoagulation. Presents with a less than 24-hour history of recurrent bright-red blood per rectum. Had an episode several weeks ago, which she attributed to hemorrhoids and did not seek medical attention. Developed severe lower GI bleeding last night and this morning. Presented to the Emergency Room where she was hemodynamically stable with a hemoglobin of 12. She is now admitted for further evaluation. She is coagulopathic because of her Coumadin with a supratherapeutic INR of 3.7.   PAST MEDICAL HISTORY:  1.  ASCD status post CABG.  2.  Aortic stenosis, status post aortic valve replacement.  3.  Chronic atrial fibrillation on anticoagulation status post failed cardioversion.  4.  Osteoarthritis.  5.  Benign hypertension.  6.  Reflux esophagitis.  7.  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  8.  Chronic back pain.  9.  Status post lumbar kyphoplasty.  10.  Status post breast biopsy.   MEDICATIONS:  1.  Lopressor 50 mg p.o. b.i.d.  2.  Neurontin 300 mg p.o. b.i.d.  3.  Zantac 150 mg p.o. b.i.d.  4.  Zocor 40 mg p.o. at bedtime.  5.  Torsemide 20 mg p.o. b.i.d.  6.  Mag-Ox 400 mg p.o. daily.  7.  Potassium chloride 60 mEq p.o. b.i.d.  8.  Coumadin 5 mg p.o. daily.  9.  Trazodone 50 mg p.o. at bedtime p.r.n.  10.  Metolazone 2.5 mg p.o. 2 times per week.  11.  Zoloft 50 mg p.o. daily.  12.  Aspirin 81 mg p.o. daily.   ALLERGIES:  ACE INHIBITOR AND SULFA.   SOCIAL HISTORY:  Negative for alcohol  or tobacco abuse.   FAMILY HISTORY:  Positive for lung disease, diabetes, coronary artery disease.   REVIEW OF SYSTEMS:  CONSTITUTIONAL:  No fever or change in weight.  EYES:  No blurred or double vision. No glaucoma.  ENT:  No tinnitus or hearing loss. No nasal discharge or bleeding. No difficulty swallowing.  RESPIRATORY:  No cough or wheezing. Denies hemoptysis.  CARDIOVASCULAR:  No chest pain or orthopnea. No palpitations or syncope.  GASTROINTESTINAL:  No nausea, vomiting, or diarrhea. No abdominal pain.  GENITOURINARY:  No dysuria or hematuria.  ENDOCRINE:  No polyuria or polydipsia. No heat or cold intolerance.  HEMATOLOGIC:  Denies anemia, easy bruising.  LYMPHATIC:  No swollen glands.  MUSCULOSKELETAL:  The patient has pain in her back but denies pain in her neck, shoulders, knees, or feet. No gout.  NEUROLOGIC:  No numbness or weakness. Denies seizures.  PSYCHIATRIC:  Denies anxiety, insomnia or depression.   PHYSICAL EXAMINATION:  GENERAL:  The patient is in no acute distress.  VITAL SIGNS:  Vital signs are currently remarkable for a blood pressure of 131/64 with a heart rate of 67, a respiratory rate of 24 and a temperature of 98.4.  HEENT:  Normocephalic, atraumatic. Pupils equally round and reactive to light and accommodation, extraocular movements are intact. Sclerae are anicteric. Conjunctivae are clear.  OROPHARYNX:  Clear.  NECK:  Supple without JVD. No adenopathy or  thyromegaly is noted.  LUNGS:  Reveal basilar crackles without wheezes or rales. No dullness. Respiratory effort is normal.  CARDIAC:  Irregularly irregular rhythm. No significant rubs or gallops.  ABDOMEN:  Soft, nontender, with normoactive bowel sounds. No organomegaly or masses were appreciated. No hernias or bruits were noted.  EXTREMITIES:  Revealed 2+ edema with stasis changes. Pulses were 2+ bilaterally.  SKIN:  Warm and dry without rash or lesions.  NEUROLOGIC:  Revealed cranial nerves II through  XII grossly intact. Deep tendon reflexes were symmetric. Motor and sensory examination is nonfocal.  PSYCHIATRIC:  Exam revealed a patient who is alert and oriented to person, place, and time. She was cooperative and used good judgment.   LABORATORY DATA:  EKG revealed a-fib with a rate of 67 with a right bundle branch block but no acute ischemic changes. Chest x-ray showed cardiomegaly with mild pulmonary edema. Urinalysis was abnormal with 3+ leukocyte esterase and 237 WBCs per high-power field. Her pro-time was 35.4, with an INR of 3.7. White count 7.3 with a hemoglobin of 12.0. Glucose was 109 with a BUN of 30, creatinine of 0.98 and a sodium of 141 with a potassium 4.6 and a GFR of 57.   ASSESSMENT:  1.  Lower gastrointestinal bleed.  2.  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  3.  Chronic atrial fibrillation.  4.  ASCD status post CABG.  5.  Status post aortic valve replacement.  6.  Chronic systolic congestive heart failure.  7.  Venous stasis with peripheral edema.   PLAN:  We will hold Coumadin for now. Hold aspirin for now. Was given vitamin K in the Emergency Room. We will follow her hemoglobin and pro-times closely. We will guaiac all stools and begin IV Protonix. We will treat her UTI with oral antibiotics for now but send off a urine culture. We will obtain consults from Cardiology and Gastroenterology. Follow up routine labs in the morning. Continue her other outpatient medications for now. Further treatment and evaluation will depend upon the patient's progress.   TOTAL TIME SPENT WITH THIS PATIENT:  50 minutes.    ____________________________ Duane Lope Judithann Sheen, MD jds:jm D: 10/15/2013 10:20:43 ET T: 10/15/2013 11:10:49 ET JOB#: [id number]  cc: Duane Lope. Judithann Sheen, MD, <Dictator> Danella Penton, MD Yuktha Kerchner Rodena Medin MD ELECTRONICALLY SIGNED 10/15/2013 12:47

## 2015-04-22 NOTE — Discharge Summary (Signed)
PATIENT NAME:  [name] [name] MR#:  [id number] DATE OF BIRTH:  04/23/1941  DATE OF ADMISSION:  12/24/2011 DATE OF DISCHARGE:  12/26/2011  DIAGNOSES AT TIME OF DISCHARGE:  1. Acute respiratory failure with hypoxemia secondary to fluid overload and congestive heart failure.  2. Hypertension.  3. History of aortic valve replacement.  4. Obesity.  5. Atrial fibrillation.  6. History of gastroesophageal reflux disease.   CHIEF COMPLAINT: Shortness of breath, hypoxemia, weight gain, bilateral ankle edema.   HISTORY OF PRESENT ILLNESS: [name] [name] is [name] 74-year-old female with history of hypertension, aortic valve replacement, atrial fibrillation, congestive heart failure, hyperlipidemia presented to the clinic complaining of increasing shortness of breath, weight gain, was noted to be hypoxemic with oxygen saturation of 86% that improved to 89% with supplemental oxygen. Patient denied any chest pain. No fevers, no chills, no vomiting or diarrhea.   PAST MEDICAL HISTORY:  1. Aortic stenosis. 2. Atrial fibrillation.  3. History of aortic valve replacement with Edwards Magna pericardial valve on 05/25/2008. 4. History of coronary artery disease status post coronary artery bypass graft. 5. History of endoscopic vein harvest of the right leg saphenous vein. 6. Previous left pleural chest tube for drainage of pleural effusion. 7. History of osteoarthritis. 8. She had an echo done in 06/2009 that showed an ejection fraction of 45%.  9. She also had history of reflux esophagitis.   PAST SURGICAL HISTORY:  1. CABG x2.  2. Aortic valve replacement. 3. Lumbar kyphoplasty. 4. Dupuytren's resection. 5. Bilateral tubal ligation. 6. Breast biopsy.   PHYSICAL EXAMINATION: GENERAL: She was dyspneic at rest, obese, hypoxemic on room air, mild distress. VITAL SIGNS: Weight 315 pounds. HEENT: Normocephalic, atraumatic. NECK: Supple. No thyromegaly. HEART: S1, S2. LUNGS: Bibasilar crackles. ABDOMEN:  Soft. EXTREMITIES: 2+ edema. NEUROLOGIC: Nonfocal.   LABORATORY, DIAGNOSTIC AND RADIOLOGICAL DATA: Glucose 92, BUN 29, creatinine 1.1, sodium 140, potassium 3.9, chloride 103, CO2 26, calcium 9, ALT 60, AST 24, total protein 7.3, albumin 3.2. Troponin less than 0.02. CK 68, CK-MB 0.8, hemoglobin 13, WBC count 11, hematocrit 40, platelets 235, PT 21, INR 1.9. Chest x-ray showed cardiomegaly with findings consistent with mild congestive heart failure.   HOSPITAL COURSE: Patient was admitted to [facility] and started on IV Lasix. During her stay in the hospital she felt better with her leg edema also improved significantly, however, she did need supplemental oxygen. She was continued on her home medications with oxygen, IV Lasix. Patient symptomatically improved and was allowed home on the following medications.   DISCHARGE MEDICATIONS:  1. Aspirin 81 mg [name] day. 2. Metolazone 2.5 mg 2 times [name] week.  3. Torsemide 20 mg b.i.d.  4. Ranitidine 150 mg b.i.d.  5. Metoprolol tartrate 50 mg b.i.d.  6. Warfarin 5 mg [name] day. 7. Sertraline 50 mg [name] day.  8. Losartan 100 mg [name] day.  9. Simvastatin 40 mg at bedtime.  10. Gabapentin 300 mg b.i.d.  11. KCl 2 tablets in the morning and 2 tabs in the night.  12. Hydrocodone 325/5, 1 tablet every four p.r.n.   DIET: Patient has been advised to also follow [name] low sodium diet.   OXYGEN: 2 liters nasal cannula was set up for her.   FOLLOW UP: She was advised to follow up with Dr. Miller in 1 to 2 weeks times. Advised to have [name] CBC, MET-B and PT/ INR in one week.  ____________________________ Vishwanath Hande, MD vh:cms D: 12/29/2011 17:15:06 ET T: 01/01/2012 11:06:30 ET JOB#: [id number]    cc: Tracie Harrier, MD, <Dictator> Tracie Harrier MD ELECTRONICALLY SIGNED 01/06/2012 17:38

## 2015-04-22 NOTE — H&P (Signed)
PATIENT NAME:  [name] [name], WEBER MR#:  [id number] DATE OF BIRTH:  1941/08/22  DATE OF ADMISSION:  12/24/2011  CHIEF COMPLAINT: Shortness of breath, weight gain of 15 pounds, and bilateral ankle edema.   HISTORY OF PRESENT ILLNESS: [name] [name] is a 75 year old female with a history of hypertension, aortic valve replacement, atrial fibrillation, history of congestive heart failure, and hyperlipidemia who presented to the clinic complaining of increasing shortness of breath associated with bilateral leg edema and weight gain of approximately 15 pounds. The patient denies any chest pain. She has had no fevers, no chills, no vomiting, and no diarrhea.   PAST MEDICAL HISTORY:  1. Aortic stenosis. 2. Atrial fibrillation.  3. 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 4. History of coronary artery disease status post coronary artery bypass graft on 05/25/2008.  5. Endoscopic vein harvest of the right leg saphenous vein. 6. Left pleural chest tube for drainage of pleural effusion. 7. Previous echocardiogram has shown moderate left ventricular hypertrophy with ejection fraction of 45%, significant tricuspid regurgitation with dilated LA with mildly reduced right ventricular function, on 07/17/2009.  8. History of osteoarthritis of right hip and hands. 9. History of hypertension. 10. Reflux esophagitis.   PAST SURGICAL HISTORY:  1. Breast biopsy.  2. Bilateral tubal ligation.  3. Dupuytren's resection. 4. Lumbar kyphoplasty. 5. Aortic valve replacement. 6. CABG x2 at [facility].   ALLERGIES: She is allergic to ACE inhibitors which gives her a cough and sulfa causes rash and swelling, however, she is able to tolerate both Lasix and torsemide.   FAMILY HISTORY: Father with lung disease from smoke inhalation. Mom died at age 86 of myocardial infarction and diabetes.   SOCIAL HISTORY: The patient is a nonsmoker and does not drink alcohol. She is  married with two children.   REVIEW OF SYSTEMS: The patient has chronic back pain. She denies any fevers, chills, nausea or vomiting and no diarrhea. No chest pain and shortness of breath with mild to moderate exertion.   PHYSICAL EXAMINATION:   VITALS: In the clinic she was noted to have an oxygen saturation of 86% on room air which improved to 89% after about 10 minutes. She was in mild distress. Weight 315 pounds, blood pressure 118/68, and pulse 92.   HEENT: Normocephalic, atraumatic. Pupils equal and reactive to light.   NECK: No thyromegaly.   HEART: S1 and S2.   LUNGS: Bibasilar crackles.   ABDOMEN: Soft, obese, and nontender.   EXTREMITIES: 2+ edema.   NEUROLOGIC: Alert and oriented x3. No obvious focal signs. Evidence of osteoarthritis noted in both hands.  LABS/STUDIES: Glucose 92, BUN 29, creatinine 1.1, sodium 140, potassium 3.9, chloride 103, CO2 26, calcium 9.0, ALT 16, AST 24, total protein 7.3, and albumin 3.2. Troponin less than 0.02. CK 68 and CK-MB 0.8. Hemoglobin 13, WBC count 11, hematocrit 40, and platelets 235. PT was 21.4 with an INR of 1.9.   Chest x-ray showed cardiomegaly with findings consistent with mild congestive heart failure.    IMPRESSION:  1. Acute respiratory failure with weight gain and bipedal edema, most likely secondary to systolic congestive heart failure.  2. History of hypertension.  3. History of aortic valve replacement, on Coumadin.   PLAN: The patient is  admitted to [facility]. We will give her IV Lasix 40 mg twice a day, continue Losartan for atrial fibrillation and continue diltiazem 180 mg once a day. For hyperlipidemia continue simvastatin. We will cycle cardiac enzymes  to rule out for myocardial infarction and also obtain an echocardiogram. I was unable to admit her directly from the clinic. She was therefore sent to the ER for supplemental oxygen and subsequently admitted to the floor. The patient was stable at the time of admission.   ____________________________ Barbette ReichmannVishwanath Madsen Riddle, MD vh:slb D: 12/24/2011 16:59:05 ET T: 12/24/2011 17:29:36 ET JOB#: [id number]  cc: Barbette ReichmannVishwanath Emigdio Wildeman, MD, <Dictator> Barbette ReichmannVISHWANATH Macauley Mossberg MD ELECTRONICALLY SIGNED 01/06/2012 17:38

## 2015-04-22 NOTE — Consult Note (Signed)
Brief Consult Note: Diagnosis: Contusions to both knees; DJD bilateral knees.   Patient was seen by consultant.   Consult note dictated.   Orders entered.   Comments: Cold therapy ordered. Elevation. Wound care as per consultant's note. Patient would benefit from PT for strengthening, balance, and gait assessment when deemed medically appropriate. Will sign off.  Please call with questions.  Electronic Signatures: Donato HeinzHooten, James P (MD)  (Signed 11-Feb-13 19:34)  Authored: Brief Consult Note   Last Updated: 11-Feb-13 19:34 by Donato HeinzHooten, James P (MD)

## 2015-04-22 NOTE — Discharge Summary (Signed)
PATIENT NAME:  [name] [name], Schuyler A MR#:  [id number] DATE OF BIRTH:  [birth year]  DATE OF ADMISSION:  02/08/2012 DATE OF DISCHARGE:  02/13/2012   DISCHARGE DIAGNOSES: 1. 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  2. Severe knee hemarthrosis.  3. Acute renal failure, prerenal, resolved.  4. Acute cellulitis.  5. Chronic atrial fibrillation.  6. Chronic systolic congestive heart failure.  7. Anemia, post bleed.  8. Gastroesophageal reflux disease.  9. Coronary artery disease.   DISCHARGE MEDICATIONS:  1. 2 liters O2 nasal cannula.  2. Torsemide 20 mg b.i.d. times 5 days and then daily.  3. Simvastatin 40 mg at bedtime. Start in one week.  4. Zoloft 50 mg daily.  5. Ranitidine 150 mg b.i.d.  6. Potassium chloride 20 mEq b.i.d.    7. Metoprolol tartrate 50 mg b.i.d.  8. Metolazone 2.5 mg daily.  9. Magnesium oxide 400 mg daily.  10. Diltiazem ER 120 mg daily.  11. Gabapentin 100 mg t.i.d.  12. Bactroban cream to knees twice a day. Wound care bilateral knees, left shoulder.  13. Doxycycline 100 mg b.i.d. times 10 days.  14. Levaquin 500 mg daily times 10 days.  15. Flora-Q b.i.d. times 2 weeks.  16. Tylenol 650 mg q. 4 p.r.n.  17. Norco 5/325, 1 q. 4 hours p.r.n. pain.  18. Milk of Magnesia 30 mL daily p.r.n.  19. Aspirin 81 mg daily. 20. Triamcinolone cream 0.1% apply b.i.d. to lower legs.   REASON FOR ADMISSION: 74 year old female presents with falls, ataxia, and hemarthrosis. Please see History and Physical for history of present illness, past medical history, and physical exam.   HOSPITAL COURSE: The patient was admitted and found to be somewhat hypotensive. She was hydrated aggressively. Her creatinine came from 3.67 down to 1.1 on discharge. Her hemoglobin dropped from 11.1 and was stable at 9.3. Her Coumadin was discontinued and reversed with vitamin K. It was found that her multiple falls, ataxia, and trouble swallowing was due to an acute cerebellar infarct. Her aspirin  was reinitiated on discharge. She received wound care and physical therapy, which will need to be continued. Her cellulitis in her lower extremities was treated initially with IV antibiotics then switched to p.o. antibiotics. Her white count was improving. Her atrial fibrillation rate was controlled with metoprolol and diltiazem ER re-added on discharge. Great care will need to be made to follow renal function and to avoid reaccumulation of fluid and congestive heart failure. Overall prognosis is good. Ultimately she may need to go back on Coumadin but currently is not a Coumadin candidate with her ataxia, falls, and obvious bleeding.      ____________________________ Danella PentonMark F. Stachia Slutsky, MD mfm:bjt D: 02/13/2012 08:05:18 ET T: 02/13/2012 08:27:11 ET JOB#: [id number]  cc: Danella PentonMark F. Shenetta Schnackenberg, MD, <Dictator> Herve Haug Sherlene ShamsF Jarelle Ates MD ELECTRONICALLY SIGNED 02/14/2012 10:30

## 2015-04-22 NOTE — H&P (Signed)
PATIENT NAME:  [name] [name], [name] [name] MR#:  [id number] DATE OF BIRTH:  02/09/1941  DATE OF ADMISSION:  02/08/2012  CHIEF COMPLAINT: Weakness.   HISTORY OF PRESENT ILLNESS: [name] [name] is a 74 year old woman with history of congestive heart failure who has had recurrent falls over the past few weeks. She states that she was okay until May 2012 when she fell and chipped a piece of bone in her back. Since then she has had recurrent falls, weakness, and swelling in her legs. Her son accompanies her today and said that she has been confused off and on as well. About four weeks ago she developed right-sided weakness but was not worked up for stroke. Yesterday she fell and hurt her knees and left shoulder. She finally came into the hospital because she fell this morning and was on the floor until this afternoon when her sister returned from church and found her.  On Thursday her Coumadin was stopped secondary to the recurrent falls.   REVIEW OF SYSTEMS: She denies any fever, but has had weakness and fatigue. No weight loss or weight gain.  No diplopia, blurry vision, eye pain, or floaters. No rhinorrhea, epistaxis, or nasal discharge. No tinnitus or hearing changes. No dysphagia or odynophagia. No wheezing, cough, or dyspnea. No pleuritic chest pain. No chest pain, tachycardia, or palpitations. No orthopnea or PND. No nausea, vomiting, diarrhea, or constipation. No melena, hematochezia, or hematemesis. No dysuria, hematuria, nocturia, or incontinence.  No joint pain or joint stiffness, but she does have redness and swelling in both of her knees. No rashes, pruritus, or other skin changes. No headaches, weakness, numbness, or tingling aside from what is described in the history of present illness. No heat or cold intolerance. No polyuria or polydipsia. No polyphagia. No easy bruising, easy bleeding, anemia, or swollen glands.  PRIMARY CARE PHYSICIAN:  Dr. Bethann Punches PRIMARY CARDIOLOGIST:  Dr. Mariah Milling   PAST MEDICAL  HISTORY:  1. Aortic stenosis with an aortic valve replacement with an [facility] pericardial bovine valve in 2009.  2. Atrial fibrillation.  3. Coronary artery disease, status post bypass grafting in 2009.  4. Osteoarthritis.  5. Hypertension.  6. Gastroesophageal reflux disease.  7. Congestive heart failure with an ejection fraction of 45%   FAMILY HISTORY: Father with lung disease from smoke inhalation. Mother died at age 65 from myocardial infarction and diabetes.   SOCIAL HISTORY: She is a nonsmoker. No alcohol. She lives alone and has two children. She is a F[employer]. Contact person and POA is her husband [name] [name]. He can be reached at [phone].   ALLERGIES: She is allergic to sulfa, and ACE inhibitors cause a cough.   HOME MEDICATIONS:  1. Torsemide 20 mg p.o. daily.  2. Simvastatin 40 mg p.o. daily.  3. Sertraline 50 mg p.o. daily.  4. Ranitidine 150 mg p.o. b.i.d.  5. KCl 20 mg p.o. b.i.d.  6. Metoprolol 50 mg p.o. b.i.d.  7. Metolazone 2.5 mg p.o. daily.  8. Mag-Oxide 500 mg p.o. daily.  9. Hydroxyzine 25 mg p.o. t.i.d.  10. Hydrocodone p.r.n.  11. Gabapentin 300 mg p.o. b.i.d.  12. Diltiazem 180 mg p.o. daily.  13. Aspirin 81 mg p.o. daily.   PHYSICAL EXAMINATION:  VITAL SIGNS: On admission, temperature 98.3, pulse 78, respiratory rate 23, blood pressure 70/51, oxygen saturation is 96% on room air.   GENERAL: She is obese and is writhing in pain.   HEENT: Conjunctivae and lids reveal no erythema or pallor. Pupils  are equal, round, and reactive to light. Ears and nose without lesions or masses.  Oropharynx clear. Oral mucosa is extremely dry. Lips are extremely dry. Posterior pharynx is free of exudate. Hearing is intact to whispered voice.   NECK: Supple with no masses. Thyroid is not enlarged. There are no nodules.   LUNGS: She does appear tachypneic, but is not in any respiratory distress. There are no intercostal retractions or use of accessory  muscles. Lungs are clear throughout.   HEART: Irregularly irregular. No murmurs, rubs, or gallops can be appreciated. Pedal pulses are 2+ and equal bilaterally.   ABDOMEN: Soft, nontender, nondistended. Liver and spleen are not enlarged. She has normoactive bowel sounds.   LYMPH: Neck and axilla are free of lymphadenopathy.   MUSCULOSKELETAL: She has no clubbing, cyanosis, or edema. She has full range of motion in her upper extremities with normal strength. Lower extremity mobility is limited by pain. Both of her legs are red from mid thigh down to her feet. There are scattered ecchymoses and multiple abrasions with black eschar over both of her knees. Left shoulder is covered in a large ecchymotic area.   NEUROLOGIC: Cranial nerves II through XII are grossly intact. Deep tendon reflexes are 2+ and equal bilaterally. Judgment and insight are intact. The patient is oriented to person, place, and time.   LABORATORY, DIAGNOSTIC, AND RADIOLOGICAL DATA: White blood cell count is 11.5, hematocrit 33.4, platelets 232, sodium 135, potassium 6.5, chloride 101, bicarbonate 26, BUN 89, creatinine 3.67, baseline creatinine is 1.64, glucose 85, bilirubin is elevated at 1.4. Urinalysis is bland. CK 337, MB is 2.5. Troponin is less than 0.02. Pro BNP is 3623, INR 5.4.  Lower extremity duplex reveals no DVTs bilaterally. Left femur film reveals no fracture. Right femur film reveals no fracture. CT of the head reveals no acute intracranial bleed. AP of the pelvis reveals no fracture. Chest x-ray reveals no pneumonia, but possibly congestive heart failure.   ASSESSMENT AND PLAN:  74 year old woman with congestive heart failure who presents with weakness and lower extremity bruising with possible cellulitis status post multiple falls.  Weakness, right greater than left: Etiology is unclear, but a missed cerebrovascular accident is possible, although she does not have focal weakness on exam. She does report right-sided  weakness. PT and OT have been ordered. We will defer MRI to primary team.  1. Acute renal failure: Early rhabdomyolysis versus poor forward flow given that her BNP is 3623. Her volume status at this point appears dry; however, BNP is slightly puzzling. Obesity can make the BNP inaccurate. We will plan to hold off on hydration or diuresis for now until volume status becomes clearer. We will trend her CKs until they begin to trend down.  2. Bruising/cellulitis: Imaging is negative for any fractures. We will plan to treat with IV clindamycin. I am holding aspirin and Coumadin, given that her level is super therapeutic and the indication is atrial fibrillation. She does not need Coumadin for her aortic valve replacement given that it is a bovine aortic valve. She has intrinsic deep vein thrombosis prophylaxis for now, but is high risk and would likely need subcutaneous heparin once her INR is less than 2.  3. Hyperkalemia: Likely secondary to acute renal failure and taking p.o. potassium. We will give Kayexalate and monitor.   TOTAL TIME SPENT ON CARE AND COORDINATION: 55 minutes.  ____________________________ Lise AuerJennifer L. Manson PasseyBrown, MD jlb:bjt D: 02/08/2012 20:18:42 ET T: 02/09/2012 07:24:42 ET JOB#: [id number]  cc: Victorino DikeJennifer L.  Manson Passey, MD, <Dictator> Danella Penton, MD Eber Hong MD ELECTRONICALLY SIGNED 02/10/2012 18:23

## 2015-04-22 NOTE — Op Note (Signed)
PATIENT NAME:  Edwena BlowDICKENS, Nakari A MR#:  [id number] DATE OF BIRTH:  October 19, 1941  DATE OF PROCEDURE:  04/14/2012  PREOPERATIVE DIAGNOSIS: Chronic open wound right lower extremity.   POSTOPERATIVE DIAGNOSIS: Chronic open wound right lower extremity.   PROCEDURE PERFORMED: Sharp excisional debridement of skin and soft tissue and initial application of wound VAC assisted closure device less than 50 sq cm.   SURGEON: Earlie Arciga A. Egbert GaribaldiBird, MD  ASSISTANT: None.   ANESTHESIA: General.   FINDINGS: 4 x 6 cm wound of the soft tissue and fat of the right anterior leg just below the patella. No purulence was found.   SPECIMENS: None.   DESCRIPTION OF PROCEDURE: With the patient in the supine position, general anesthesia induced, the right leg was sterilely prepped and draped with chlorhexidine solution. Timeout was observed.   The existing wound was debrided sharply with Mayo scissors of fibrinous material. The existing skin bridge was excised with electrocautery and discarded. The wound did not appear to tract deep. With hemostasis obtained with point cautery a black piece of wound VAC foam was applied, bridged on the anterior leg with bio-occlusive film and placed in line 125 pressure canister without difficulty. The patient was then subsequently extubated and taken to the recovery room in stable and satisfactory condition by anesthesia services.  ____________________________ Redge GainerMark A. Egbert GaribaldiBird, MD mab:cms D: 04/14/2012 15:55:50 ET T: 04/15/2012 10:16:22 ET  JOB#: [id number] cc: Loraine LericheMark A. Egbert GaribaldiBird, MD, <Dictator> Sheppard Plumberimothy E. Thelma Bargeaks, MD Nylene Inlow Kela MillinA Lititia Sen MD ELECTRONICALLY SIGNED 04/16/2012 22:01

## 2015-04-22 NOTE — Consult Note (Signed)
PATIENT NAME:  [name] [name], [name] A MR#:  [id number] DATE OF BIRTH:  [birth year]  DATE OF CONSULTATION:  02/09/2012  REFERRING PHYSICIAN:  Dr. Boneta LucksJennifer Brown CONSULTING PHYSICIAN:  Illene LabradorJames P. Angie FavaHooten Jr., MD  CHIEF COMPLAINT: Frequent falls and bruising to both knees.   HISTORY OF PRESENT ILLNESS: The patient is a 74 year old female who has had recurrent falls that she dates back over the last years time. She reports some difficulty with her balance as well as some generalized weakness. She has been on Coumadin and has had significant bruising to both lower legs with some scabs forming over the anterior aspect of the knees. She apparently fell on the day of admission and has had significant difficulty with ambulation due to relative weakness and bilateral leg swelling.   PAST MEDICAL/SURGICAL HISTORY:  1. Aortic stenosis status post aortic valve replacement. 2. Atrial fibrillation. 3. Coronary artery disease. 4. Osteoarthritis.  5. Hypertension.  6. Gastroesophageal reflux disease. 7. Congestive heart failure. 8. Depression.  9. Hypercholesterolemia.  10. Status post coronary artery bypass graft. 11. Status post foot surgeries. 12. Status post cataract surgery.  13. Status post lumpectomy. 14. Status post L4 kyphoplasty.   ALLERGIES: Sulfa drugs, ACE inhibitors.   MEDICATIONS AT TIME OF ADMISSION:  1. Torsemide 20 mg daily.  2. Simvastatin 40 mg daily.  3. Sertraline 50 mg daily. 4. Ranitidine 150 mg b.i.d.  5. Potassium chloride 20 mEq b.i.d.  6. Metoprolol 50 mg b.i.d.  7. Metolazone 2.5 mg daily. 8. Mag-Ox 500 mg daily.  9. Hydroxyzine 25 mg t.i.d.  10. Hydrocodone p.r.n.  11. Gabapentin 300 mg b.i.d.  12. Diltiazem 180 mg daily.  13. Aspirin 81 mg daily.   FAMILY HISTORY: Positive for lung cancer in father; myocardial infarction, diabetes in mother.   SOCIAL HISTORY: Patient denies any current smoking or alcohol use. She lives alone and has two children.   REVIEW OF  SYSTEMS: No fevers, chills. Positive for weakness and generalized fatigue. No vision changes, runny nose, tinnitus, hearing changes, dysphagia. No gross wheezing, coughing or dyspnea. No chest pain or tachycardia or palpitations. No nausea or vomiting. No bright red blood per rectum or melena. No dysuria. Some joint pain to both knees. No headaches. No numbness or gross paresthesias. Positive for ecchymosis.   PHYSICAL EXAMINATION:  GENERAL: Patient is an elderly obese female seen laying in the bed in no acute distress.   HEENT: Sclerae are clear. Extraocular motion is intact. Oropharynx is clear with moist mucosa.   NECK: Supple, nontender, with good range of motion.   LUNGS: Clear to auscultation bilaterally.   CARDIAC: Irregular rate and rhythm. No appreciable murmurs. No JVD. Pedal pulses are palpable. Homans test is negative.   ABDOMEN: Soft, nontender, nondistended.   LOWER EXTREMITIES: Significant ecchymosis is noted extending to an area superior to both knees. Some erythema is noted to the ankle and pretibial areas bilaterally. Eschars are noted along the anterior aspect of the knees. It is difficult to discern effusion to either knee. Both knees are stable to varus and valgus stress. Lachman test is negative bilaterally. Patient demonstrates approximately 60 degrees of flexion bilaterally. Further flexion is limited secondary to guarding. Generalized quadriceps atrophy is appreciated bilaterally. No gross hypermobility of the patella. The patient has some difficulty performing straight leg raise independently secondary to generalized weakness, although with elevation the legs can be maintained in an elevated position.   NEUROLOGIC: Awake, alert. Poor insight. Patient is oriented to person, place, and time. Sensory function  is grossly intact. Motor strength is generally decreased in both the upper and lower extremities. Reasonably good motor coordination is appreciated. No clonus or tremor.    LABORATORY, DIAGNOSTIC AND RADIOLOGICAL DATA: AP, lateral, oblique radiographs of both knees obtained at [facility] were reviewed. Although the films are nonweightbearing, there are findings suggestive of narrowing of the medial cartilage space bilaterally. Diffuse osteopenia is noted. No evidence of fracture is appreciated.   IMPRESSION:  1. Bilateral lower extremity ecchymoses and contusions to both knees.  2. Degenerative arthrosis of both knees.   PLAN: Findings were discussed in detail with the patient. I would agree with discontinuation of Coumadin. I have ordered cold therapy to alternate between both knees as well as elevation so as to decrease swelling. I believe the patient would benefit from physical therapy when deemed appropriate medically.    ____________________________ Illene Labrador. Angie Fava., MD jph:cms D: 02/09/2012 19:32:54 ET T: 02/10/2012 09:26:01 ET JOB#: [id number]  cc: Fayrene Fearing P. Angie Fava., MD, <Dictator> Illene Labrador Angie Fava MD ELECTRONICALLY SIGNED 02/11/2012 19:01
